# Patient Record
Sex: Female | Born: 1955 | Race: White | Hispanic: No | Marital: Married | Smoking: Former smoker
Health system: Southern US, Community
[De-identification: ages and names within clinical notes are randomized; demographics above are authoritative.]

## PROBLEM LIST (undated history)

## (undated) DIAGNOSIS — I10 Essential (primary) hypertension: Secondary | ICD-10-CM

## (undated) DIAGNOSIS — K219 Gastro-esophageal reflux disease without esophagitis: Secondary | ICD-10-CM

## (undated) DIAGNOSIS — F411 Generalized anxiety disorder: Secondary | ICD-10-CM

## (undated) HISTORY — DX: Essential (primary) hypertension: I10

## (undated) HISTORY — DX: Generalized anxiety disorder: F41.1

## (undated) HISTORY — PX: CERVICAL FUSION: SHX112

## (undated) HISTORY — PX: FOOT SURGERY: SHX648

## (undated) HISTORY — DX: Gastro-esophageal reflux disease without esophagitis: K21.9

---

## 2015-02-11 ENCOUNTER — Other Ambulatory Visit (HOSPITAL_BASED_OUTPATIENT_CLINIC_OR_DEPARTMENT_OTHER): Payer: Self-pay | Admitting: Neurosurgery

## 2015-02-11 DIAGNOSIS — M5412 Radiculopathy, cervical region: Secondary | ICD-10-CM

## 2015-02-21 ENCOUNTER — Ambulatory Visit (HOSPITAL_BASED_OUTPATIENT_CLINIC_OR_DEPARTMENT_OTHER)
Admission: RE | Admit: 2015-02-21 | Discharge: 2015-02-21 | Disposition: A | Discharge status: Home / Self Care | Payer: BLUE CROSS/BLUE SHIELD | Source: Ambulatory Visit | Attending: Neurosurgery | Admitting: Neurosurgery

## 2015-02-21 DIAGNOSIS — M5412 Radiculopathy, cervical region: Secondary | ICD-10-CM

## 2015-02-21 DIAGNOSIS — M542 Cervicalgia: Secondary | ICD-10-CM | POA: Diagnosis present

## 2015-02-21 DIAGNOSIS — M4682 Other specified inflammatory spondylopathies, cervical region: Secondary | ICD-10-CM | POA: Insufficient documentation

## 2015-02-21 DIAGNOSIS — M4683 Other specified inflammatory spondylopathies, cervicothoracic region: Secondary | ICD-10-CM | POA: Insufficient documentation

## 2015-09-16 ENCOUNTER — Encounter (HOSPITAL_BASED_OUTPATIENT_CLINIC_OR_DEPARTMENT_OTHER): Payer: Self-pay | Admitting: *Deleted

## 2015-09-16 ENCOUNTER — Emergency Department (HOSPITAL_BASED_OUTPATIENT_CLINIC_OR_DEPARTMENT_OTHER): Payer: BLUE CROSS/BLUE SHIELD

## 2015-09-16 ENCOUNTER — Emergency Department (HOSPITAL_BASED_OUTPATIENT_CLINIC_OR_DEPARTMENT_OTHER)
Admission: EM | Admit: 2015-09-16 | Discharge: 2015-09-16 | Disposition: A | Discharge status: Home / Self Care | Payer: BLUE CROSS/BLUE SHIELD | Attending: Emergency Medicine | Admitting: Emergency Medicine

## 2015-09-16 DIAGNOSIS — R51 Headache: Secondary | ICD-10-CM | POA: Insufficient documentation

## 2015-09-16 DIAGNOSIS — Z79899 Other long term (current) drug therapy: Secondary | ICD-10-CM | POA: Diagnosis not present

## 2015-09-16 DIAGNOSIS — I10 Essential (primary) hypertension: Secondary | ICD-10-CM | POA: Diagnosis not present

## 2015-09-16 DIAGNOSIS — R079 Chest pain, unspecified: Secondary | ICD-10-CM

## 2015-09-16 DIAGNOSIS — R0602 Shortness of breath: Secondary | ICD-10-CM | POA: Diagnosis not present

## 2015-09-16 DIAGNOSIS — R42 Dizziness and giddiness: Secondary | ICD-10-CM | POA: Diagnosis not present

## 2015-09-16 DIAGNOSIS — R61 Generalized hyperhidrosis: Secondary | ICD-10-CM | POA: Insufficient documentation

## 2015-09-16 DIAGNOSIS — R110 Nausea: Secondary | ICD-10-CM | POA: Insufficient documentation

## 2015-09-16 DIAGNOSIS — R1013 Epigastric pain: Secondary | ICD-10-CM | POA: Insufficient documentation

## 2015-09-16 DIAGNOSIS — F419 Anxiety disorder, unspecified: Secondary | ICD-10-CM | POA: Diagnosis not present

## 2015-09-16 DIAGNOSIS — Z87891 Personal history of nicotine dependence: Secondary | ICD-10-CM | POA: Insufficient documentation

## 2015-09-16 HISTORY — DX: Essential (primary) hypertension: I10

## 2015-09-16 LAB — CBC
HCT: 36.3 % (ref 36.0–46.0)
Hemoglobin: 12.2 g/dL (ref 12.0–15.0)
MCH: 29.3 pg (ref 26.0–34.0)
MCHC: 33.6 g/dL (ref 30.0–36.0)
MCV: 87.1 fL (ref 78.0–100.0)
PLATELETS: 237 10*3/uL (ref 150–400)
RBC: 4.17 MIL/uL (ref 3.87–5.11)
RDW: 12.1 % (ref 11.5–15.5)
WBC: 6.4 10*3/uL (ref 4.0–10.5)

## 2015-09-16 LAB — BASIC METABOLIC PANEL
ANION GAP: 9 (ref 5–15)
BUN: 17 mg/dL (ref 6–20)
CO2: 28 mmol/L (ref 22–32)
Calcium: 9.1 mg/dL (ref 8.9–10.3)
Chloride: 99 mmol/L — ABNORMAL LOW (ref 101–111)
Creatinine, Ser: 0.76 mg/dL (ref 0.44–1.00)
GFR calc Af Amer: >60 mL/min (ref >60)
Glucose, Bld: 104 mg/dL — ABNORMAL HIGH (ref 65–99)
POTASSIUM: 3.9 mmol/L (ref 3.5–5.1)
SODIUM: 136 mmol/L (ref 135–145)

## 2015-09-16 LAB — TROPONIN I: Troponin I: <0.03 ng/mL (ref <0.031)

## 2015-09-16 MED ORDER — ASPIRIN 81 MG PO CHEW
324.0000 mg | CHEWABLE_TABLET | Freq: Once | ORAL | Status: AC
  Administered 2015-09-16: 324 mg via ORAL
  Filled 2015-09-16: qty 4

## 2015-09-16 MED ORDER — GI COCKTAIL ~~LOC~~
30.0000 mL | Freq: Once | ORAL | Status: AC
  Administered 2015-09-16: 30 mL via ORAL
  Filled 2015-09-16: qty 30

## 2015-09-16 MED ORDER — NITROGLYCERIN 0.4 MG SL SUBL: 0.4000 mg | SUBLINGUAL_TABLET | SUBLINGUAL | Status: DC | PRN

## 2015-09-16 NOTE — Discharge Instructions (Signed)
Try Zantac 150 mg twice a day. °Nonspecific Chest Pain  °Chest pain can be caused by many different conditions. There is always a chance that your pain could be related to something serious, such as a heart attack or a blood clot in your lungs. Chest pain can also be caused by conditions that are not life-threatening. If you have chest pain, it is very important to follow up with your health care provider. °CAUSES  °Chest pain can be caused by: °· Heartburn. °· Pneumonia or bronchitis. °· Anxiety or stress. °· Inflammation around your heart (pericarditis) or lung (pleuritis or pleurisy). °· A blood clot in your lung. °· A collapsed lung (pneumothorax). It can develop suddenly on its own (spontaneous pneumothorax) or from trauma to the chest. °· Shingles infection (varicella-zoster virus). °· Heart attack. °· Damage to the bones, muscles, and cartilage that make up your chest wall. This can include: °¨ Bruised bones due to injury. °¨ Strained muscles or cartilage due to frequent or repeated coughing or overwork. °¨ Fracture to one or more ribs. °¨ Sore cartilage due to inflammation (costochondritis). °RISK FACTORS  °Risk factors for chest pain may include: °· Activities that increase your risk for trauma or injury to your chest. °· Respiratory infections or conditions that cause frequent coughing. °· Medical conditions or overeating that can cause heartburn. °· Heart disease or family history of heart disease. °· Conditions or health behaviors that increase your risk of developing a blood clot. °· Having had chicken pox (varicella zoster). °SIGNS AND SYMPTOMS °Chest pain can feel like: °· Burning or tingling on the surface of your chest or deep in your chest. °· Crushing, pressure, aching, or squeezing pain. °· Dull or sharp pain that is worse when you move, cough, or take a deep breath. °· Pain that is also felt in your back, neck, shoulder, or arm, or pain that spreads to any of these areas. °Your chest pain may  come and go, or it may stay constant. °DIAGNOSIS °Lab tests or other studies may be needed to find the cause of your pain. Your health care provider may have you take a test called an ambulatory ECG (electrocardiogram). An ECG records your heartbeat patterns at the time the test is performed. You may also have other tests, such as: °· Transthoracic echocardiogram (TTE). During echocardiography, sound waves are used to create a picture of all of the heart structures and to look at how blood flows through your heart. °· Transesophageal echocardiogram (TEE). This is a more advanced imaging test that obtains images from inside your body. It allows your health care provider to see your heart in finer detail. °· Cardiac monitoring. This allows your health care provider to monitor your heart rate and rhythm in real time. °· Holter monitor. This is a portable device that records your heartbeat and can help to diagnose abnormal heartbeats. It allows your health care provider to track your heart activity for several days, if needed. °· Stress tests. These can be done through exercise or by taking medicine that makes your heart beat more quickly. °· Blood tests. °· Imaging tests. °TREATMENT  °Your treatment depends on what is causing your chest pain. Treatment may include: °· Medicines. These may include: °¨ Acid blockers for heartburn. °¨ Anti-inflammatory medicine. °¨ Pain medicine for inflammatory conditions. °¨ Antibiotic medicine, if an infection is present. °¨ Medicines to dissolve blood clots. °¨ Medicines to treat coronary artery disease. °· Supportive care for conditions that do not require medicines. This   may include: °¨ Resting. °¨ Applying heat or cold packs to injured areas. °¨ Limiting activities until pain decreases. °HOME CARE INSTRUCTIONS °· If you were prescribed an antibiotic medicine, finish it all even if you start to feel better. °· Avoid any activities that bring on chest pain. °· Do not use any tobacco  products, including cigarettes, chewing tobacco, or electronic cigarettes. If you need help quitting, ask your health care provider. °· Do not drink alcohol. °· Take medicines only as directed by your health care provider. °· Keep all follow-up visits as directed by your health care provider. This is important. This includes any further testing if your chest pain does not go away. °· If heartburn is the cause for your chest pain, you may be told to keep your head raised (elevated) while sleeping. This reduces the chance that acid will go from your stomach into your esophagus. °· Make lifestyle changes as directed by your health care provider. These may include: °¨ Getting regular exercise. Ask your health care provider to suggest some activities that are safe for you. °¨ Eating a heart-healthy diet. A registered dietitian can help you to learn healthy eating options. °¨ Maintaining a healthy weight. °¨ Managing diabetes, if necessary. °¨ Reducing stress. °SEEK MEDICAL CARE IF: °· Your chest pain does not go away after treatment. °· You have a rash with blisters on your chest. °· You have a fever. °SEEK IMMEDIATE MEDICAL CARE IF:  °· Your chest pain is worse. °· You have an increasing cough, or you cough up blood. °· You have severe abdominal pain. °· You have severe weakness. °· You faint. °· You have chills. °· You have sudden, unexplained chest discomfort. °· You have sudden, unexplained discomfort in your arms, back, neck, or jaw. °· You have shortness of breath at any time. °· You suddenly start to sweat, or your skin gets clammy. °· You feel nauseous or you vomit. °· You suddenly feel light-headed or dizzy. °· Your heart begins to beat quickly, or it feels like it is skipping beats. °These symptoms may represent a serious problem that is an emergency. Do not wait to see if the symptoms will go away. Get medical help right away. Call your local emergency services (911 in the [place] Do not drive yourself to the  hospital. °  °This information is not intended to replace advice given to you by your health care provider. Make sure you discuss any questions you have with your health care provider. °  °Document Released: 05/18/2005 Document Revised: 08/29/2014 Document Reviewed: 03/14/2014 °Elsevier Interactive Patient Education ©2016 [employer] ° °

## 2015-09-16 NOTE — ED Provider Notes (Signed)
CSN: [id number]     Arrival date & time 09/16/15  1701 History  By signing my name below, I, Meghan Hubbard, attest that this documentation has been prepared under the direction and in the presence of Meghan Plan, DO. Electronically Signed: Linus Hubbard, ED Scribe. 09/16/2015. 6:12 PM.    Chief Complaint  Patient presents with  . Chest Pain   The history is provided by the patient. No language interpreter was used.   HPI Comments: Meghan Hubbard is a 60 y.o. female with a h/o HTN and "heart burn" who presents to the Emergency Department complaining of chest pain that began 2 weeks ago but worsened today, PTA. Pt describes her pain as if "food is stuck in her throat." She reports her pain began when she was drinking water during a workout at the gym. Pt also reports dizziness, nausea, HA, diaphoresis and SOB. Pt tried to take deep breathes to alleviate her pain with no relief. Pt denies radiation to her back or to any of her extremities. Pt was referred to the ED after being seen at [facility] today. Pt denies any BLE swelling, vomiting, fevers, chills, cough, congestion, or any other associated sx at this time. Pt is a non-smoker. Pt denies family or personal cardiac history. No recent long distance travel. She denies any recent surgeries. No prior history of cancer.  Past Medical History  Diagnosis Date  . Hypertension   . Anxiety    Past Surgical History  Procedure Laterality Date  . Cervical fusion    . Foot surgery      x 3   No family history on file. Social History  Substance Use Topics  . Smoking status: Former Games developer  . Smokeless tobacco: Never Used  . Alcohol Use: No   OB History    No data available     Review of Systems  Constitutional: Positive for diaphoresis. Negative for fever and chills.  HENT: Negative for congestion and rhinorrhea.   Eyes: Negative for redness and visual disturbance.  Respiratory: Positive for shortness of breath. Negative for cough  and wheezing.   Cardiovascular: Positive for chest pain. Negative for palpitations.  Gastrointestinal: Positive for nausea. Negative for vomiting.  Genitourinary: Negative for dysuria and urgency.  Musculoskeletal: Negative for myalgias and arthralgias.  Skin: Negative for pallor and wound.  Neurological: Positive for dizziness and headaches.      Allergies  Review of patient's allergies indicates no known allergies.  Home Medications   Prior to Admission medications   Medication Sig Start Date End Date Taking? Authorizing Provider  ALPRAZolam (XANAX PO) Take by mouth.   Yes Historical Provider, MD  BISOPROLOL-HYDROCHLOROTHIAZIDE PO Take by mouth.   Yes Historical Provider, MD  MELOXICAM PO Take by mouth.   Yes Historical Provider, MD  TIZANIDINE HCL PO Take by mouth.   Yes Historical Provider, MD   BP 103/66 mmHg  Pulse 56  Temp(Src) 98.3 F (36.8 C) (Oral)  Resp 17  Ht  (1.626 m)  Wt 161 lb (73.029 kg)  BMI 27.62 kg/m2  SpO2 96% Physical Exam  Constitutional: She is oriented to person, place, and time. She appears well-developed and well-nourished. No distress.  HENT:  Head: Normocephalic and atraumatic.  Eyes: EOM are normal. Pupils are equal, round, and reactive to light.  Neck: Normal range of motion. Neck supple.  Cardiovascular: Normal rate, regular rhythm, normal heart sounds and intact distal pulses.  Exam reveals no gallop and no friction rub.  No murmur heard. Pulmonary/Chest: Effort normal and breath sounds normal. No respiratory distress. She has no wheezes. She has no rales.  Abdominal: Soft. She exhibits no distension. There is no tenderness.  Epigastrium TTP, no RUQ tenderness  Musculoskeletal: Normal range of motion. She exhibits no edema or tenderness.  Neurological: She is alert and oriented to person, place, and time.  Skin: Skin is warm and dry. She is not diaphoretic.  Psychiatric: She has a normal mood and affect. Her behavior is normal.   Nursing note and vitals reviewed.   ED Course  Procedures  DIAGNOSTIC STUDIES: Oxygen Saturation is 100% on RA, nl by my interpretation.    COORDINATION OF CARE: 6:12 PM Will give ASA, GI cocktail and Nitroglycerin. Will order CXR and blood test.  Discussed treatment Hubbard with pt at bedside and pt agreed to Hubbard.   Labs Review Labs Reviewed  BASIC METABOLIC PANEL - Abnormal; Notable for the following:    Chloride 99 (*)    Glucose, Bld 104 (*)    All other components within normal limits  CBC  TROPONIN I  TROPONIN I    Imaging Review Dg Chest 2 View  09/16/2015  CLINICAL DATA:  Mid chest heaviness with heartburn for 2 weeks. Near syncope today. Former smoker. EXAM: CHEST  2 VIEW COMPARISON:  None. FINDINGS: Normal heart size and pulmonary vascularity. No focal airspace disease or consolidation in the lungs. No blunting of costophrenic angles. No pneumothorax. Mediastinal contours appear intact. Postoperative changes in the cervical spine. Mild degenerative changes in the thoracic spine. IMPRESSION: No active cardiopulmonary disease. Electronically Signed   By: Burman Nieves M.D.   On: 09/16/2015 18:57   I have personally reviewed and evaluated these images and lab results as part of my medical decision-making.   EKG Interpretation   Date/Time:  Wednesday September 16 2015 17:09:42 EST Ventricular Rate:  63 PR Interval:  210 QRS Duration: 78 QT Interval:  422 QTC Calculation: 431 R Axis:   51 Text Interpretation:  Sinus rhythm with 1st degree A-V block Nonspecific  ST abnormality Abnormal ECG No old tracing to compare Confirmed by Cardelia Sassano  MD, DANIEL [phone]) on 09/16/2015 6:36:11 PM      MDM   Final diagnoses:  Chest pain, unspecified chest pain type    59 yoF with atypical chest pain for two days.  Saw urgent care and referred here.  Patient low risk HEAR score. Wells 0.  Doubt PE.  Delta trop negative, symptoms significantly improved with GI cocktail.  Suspect  GERD, will start zantac, PCP follow up.   I have discussed the diagnosis/risks/treatment options with the patient and believe the pt to be eligible for discharge home to follow-up with PCP. We also discussed returning to the ED immediately if new or worsening sx occur. We discussed the sx which are most concerning (e.g., sudden worsening pain, fever, inability to tolerate by mouth) that necessitate immediate return. Medications administered to the patient during their visit and any new prescriptions provided to the patient are listed below.  Medications given during this visit Medications  aspirin chewable tablet 324 mg (324 mg Oral Given 09/16/15 1825)  gi cocktail (Maalox,Lidocaine,Donnatal) (30 mLs Oral Given 09/16/15 1826)    Discharge Medication List as of 09/16/2015  9:52 PM      The patient appears reasonably screen and/or stabilized for discharge and I doubt any other medical condition or other [facility] requiring further screening, evaluation, or treatment in the ED at this time prior  to discharge.    I personally performed the services described in this documentation, which was scribed in my presence. The recorded information has been reviewed and is accurate.     Meghan Plan, DO 09/17/15 1411

## 2015-09-16 NOTE — ED Notes (Signed)
Patient states she has a two week history of increasing indigestion, burning and burping.  States this morning she had lightheadedness and became hot and sweaty.  States she has had dizziness all day.  Also, has pressure in the central chest and sob with exertion or talking.

## 2015-09-23 ENCOUNTER — Encounter: Payer: Self-pay | Admitting: Osteopathic Medicine

## 2015-09-23 ENCOUNTER — Ambulatory Visit (INDEPENDENT_AMBULATORY_CARE_PROVIDER_SITE_OTHER): Payer: BLUE CROSS/BLUE SHIELD | Admitting: Osteopathic Medicine

## 2015-09-23 VITALS — BP 110/70 | HR 64 | Ht 64.0 in | Wt 158.0 lb

## 2015-09-23 DIAGNOSIS — K219 Gastro-esophageal reflux disease without esophagitis: Secondary | ICD-10-CM

## 2015-09-23 DIAGNOSIS — F411 Generalized anxiety disorder: Secondary | ICD-10-CM

## 2015-09-23 DIAGNOSIS — R7301 Impaired fasting glucose: Secondary | ICD-10-CM

## 2015-09-23 DIAGNOSIS — I10 Essential (primary) hypertension: Secondary | ICD-10-CM

## 2015-09-23 DIAGNOSIS — Z13220 Encounter for screening for lipoid disorders: Secondary | ICD-10-CM | POA: Diagnosis not present

## 2015-09-23 DIAGNOSIS — Z131 Encounter for screening for diabetes mellitus: Secondary | ICD-10-CM | POA: Diagnosis not present

## 2015-09-23 DIAGNOSIS — Z780 Asymptomatic menopausal state: Secondary | ICD-10-CM

## 2015-09-23 NOTE — Progress Notes (Signed)
HPI: Meghan Hubbard is a 60 y.o. female who presents to [facility] today for chief complaint of:  Chief Complaint  Patient presents with  . Establish Care    Follow-up from ER, was there for chest pain rule out    ER FOLLOWUP . Context: was having bad heartburn going on for 2 weeks, then started feeling dizzy and SOB and went to UC 1 week ago and was sent to ER   . Modifying factors: Was started on Zantac by ER and she states doing a bit better, she is taking htis about 1 per day, in the past Tums as needed.  No hx gastric ulcer.  . Assoc signs/symptoms: No black/bloody stool, no emesis. Was feeling like lump in throat but this is better. Dizziness resolved.   Anxiety - on Xanax 1/2 tab daily if needed. Occasional panic issues, SOB issues. Previously was on daily antianxiety medicine (not sure name, SSRI?) but this "made me feel like a zombie" so only wants PRN Rx.   MSK - neck pain and arthritis and takes meds as below as needed.   Just moved from [place]. Had appt to see me in 3/17 anyway for wellness exam.    Past medical, social and family history reviewed: Past Medical History  Diagnosis Date  . GERD (gastroesophageal reflux disease) 09/24/2015  . Hypertension   . Anxiety state 09/24/2015   Past Surgical History  Procedure Laterality Date  . Cervical fusion    . Foot surgery      x 3   Social History  Substance Use Topics  . Smoking status: Former Games developer  . Smokeless tobacco: Never Used  . Alcohol Use: No   Family History  Problem Relation Age of Onset  . Cancer Mother     LEUKEMIA  . Hypertension Mother   . Cancer Father     LUNG    Current Outpatient Prescriptions  Medication Sig Dispense Refill  . ALPRAZolam (XANAX PO) Take by mouth.    Marland Kitchen BISOPROLOL-HYDROCHLOROTHIAZIDE PO Take by mouth.    . MELOXICAM PO Take by mouth.    . ranitidine (ZANTAC) 150 MG tablet Take 150 mg by mouth 2 (two) times daily.    Marland Kitchen TIZANIDINE HCL  PO Take by mouth.     No current facility-administered medications for this visit.   No Known Allergies    Review of Systems: CONSTITUTIONAL:  No  fever, no chills, No  unintentional weight changes HEAD/EYES/EARS/NOSE/THROAT: No  headache, no vision change, no hearing change, No  sore throat, No  sinus pressure CARDIAC: No  chest pain, No  pressure, No palpitations, No  orthopnea RESPIRATORY: No  cough, No  shortness of breath/wheeze GASTROINTESTINAL: No  nausea, No  vomiting, No  abdominal pain, No  blood in stool, No  diarrhea, No  constipation , (+) heartburn, improved on Zantac MUSCULOSKELETAL: No  myalgia/arthralgia GENITOURINARY: No  incontinence, No  abnormal genital bleeding/discharge SKIN: rash/wounds/concerning lesions  HEM/ONC: No  easy bruising/bleeding, No  abnormal lymph node ENDOCRINE: No polyuria/polydipsia/polyphagia, No  heat/cold intolerance  NEUROLOGIC: No  weakness, No  dizziness, No  slurred speech PSYCHIATRIC: No  concerns with depression, (+) concerns with occasional anxiety, No sleep problems, PQH2 (-)  Exam:  BP 110/70 mmHg  Pulse 64  Ht  (1.626 m)  Wt 158 lb (71.668 kg)  BMI 27.11 kg/m2 Constitutional: VS see above. General Appearance: alert, well-developed, well-nourished, NAD Eyes: Normal lids and conjunctive, non-icteric sclera, PERRLA Ears,  Nose, Mouth, Throat: MMM, Normal external inspection ears/nares/mouth/lips/gums, TM normal bilaterally. Pharynx no erythema, no exudate.  Neck: No masses, trachea midline. No thyroid enlargement/tenderness/mass appreciated. No lymphadenopathy Respiratory: Normal respiratory effort. no wheeze, no rhonchi, no rales Cardiovascular: S1/S2 normal, no murmur, no rub/gallop auscultated. RRR. No lower extremity edema. Gastrointestinal: Nontender, no masses. No hepatomegaly, no splenomegaly. No hernia appreciated. Bowel sounds normal. Rectal exam deferred.  Musculoskeletal: Gait normal. No clubbing/cyanosis of digits.   Neurological: No cranial nerve deficit on limited exam. Motor and sensation intact and symmetric Skin: warm, dry, intact. No rash/ulcer. No concerning nevi or subq nodules on limited exam.   Psychiatric: Normal judgment/insight. Normal mood and affect. Oriented x3.    No results found for this or any previous visit (from the past 72 hour(s)).    ASSESSMENT/PLAN:  Gastroesophageal reflux disease, esophagitis presence not specified - patient opts to continue Zantac, advised we do have the option to do H pylori test, patient declines this at this time, we'll consider H. pylori test and PPI if symptoms worsen  Essential hypertension - Plan: CBC with Differential/Platelet, COMPLETE METABOLIC PANEL WITH GFR, TSH - given patient's symptoms of dizziness and relatively low blood pressure today, question if she really needs to be on the dose of blood pressure meds she is on, she can't member the specific dose of the combo pill as noted above, I advised her to call us with this dose, in the meantime can cut in half and follow-up with nurse visit for blood pressure check and to confirm measurement with her own cuff  Lipid screening - Plan: Lipid panel  Diabetes mellitus screening  Postmenopausal - Plan: VITAMIN D 25 Hydroxy (Vit-D Deficiency, Fractures)   Return at your convenince in next few weeks for BP check with nurse, for [facility] [place] [facility].

## 2015-09-24 ENCOUNTER — Encounter: Payer: Self-pay | Admitting: Osteopathic Medicine

## 2015-09-24 DIAGNOSIS — F411 Generalized anxiety disorder: Secondary | ICD-10-CM

## 2015-09-24 DIAGNOSIS — K219 Gastro-esophageal reflux disease without esophagitis: Secondary | ICD-10-CM

## 2015-09-24 DIAGNOSIS — I10 Essential (primary) hypertension: Secondary | ICD-10-CM

## 2015-09-24 HISTORY — DX: Gastro-esophageal reflux disease without esophagitis: K21.9

## 2015-09-24 HISTORY — DX: Essential (primary) hypertension: I10

## 2015-09-24 HISTORY — DX: Generalized anxiety disorder: F41.1

## 2015-10-09 ENCOUNTER — Telehealth: Payer: Self-pay | Admitting: Osteopathic Medicine

## 2015-10-09 NOTE — Telephone Encounter (Signed)
Pt called. She would like lab order faxed down for Mon 2/20 as she will be coming in to have blood drawn on this date. Thank you.

## 2015-10-09 NOTE — Telephone Encounter (Signed)
Dr. Alexander please see note below. Keelie Zemanek,CMA  

## 2015-10-09 NOTE — Telephone Encounter (Signed)
Labs already were ordered on visit to 117, anything else we need to do to release these results or can she just go downstairs and have these done?

## 2015-10-09 NOTE — Telephone Encounter (Signed)
Patient has been informed. Meghan Hubbard,CMA  

## 2015-10-12 LAB — COMPLETE METABOLIC PANEL WITH GFR
ALBUMIN: 4.4 g/dL (ref 3.6–5.1)
ALK PHOS: 32 U/L — AB (ref 33–130)
ALT: 17 U/L (ref 6–29)
AST: 20 U/L (ref 10–35)
BUN: 13 mg/dL (ref 7–25)
CALCIUM: 9.8 mg/dL (ref 8.6–10.4)
CHLORIDE: 102 mmol/L (ref 98–110)
CO2: 29 mmol/L (ref 20–31)
Creat: 0.8 mg/dL (ref 0.50–1.05)
GFR, EST NON AFRICAN AMERICAN: 82 mL/min (ref >=60)
Glucose, Bld: 102 mg/dL — ABNORMAL HIGH (ref 65–99)
POTASSIUM: 4.8 mmol/L (ref 3.5–5.3)
Sodium: 139 mmol/L (ref 135–146)
Total Bilirubin: 0.3 mg/dL (ref 0.2–1.2)
Total Protein: 6.6 g/dL (ref 6.1–8.1)

## 2015-10-12 LAB — CBC WITH DIFFERENTIAL/PLATELET
Basophils Absolute: 0 10*3/uL (ref 0.0–0.1)
Basophils Relative: 1 % (ref 0–1)
EOS PCT: 7 % — AB (ref 0–5)
Eosinophils Absolute: 0.3 10*3/uL (ref 0.0–0.7)
HEMATOCRIT: 38.2 % (ref 36.0–46.0)
HEMOGLOBIN: 12.7 g/dL (ref 12.0–15.0)
LYMPHS PCT: 38 % (ref 12–46)
Lymphs Abs: 1.7 10*3/uL (ref 0.7–4.0)
MCH: 29.3 pg (ref 26.0–34.0)
MCHC: 33.2 g/dL (ref 30.0–36.0)
MCV: 88.2 fL (ref 78.0–100.0)
MONO ABS: 0.4 10*3/uL (ref 0.1–1.0)
MONOS PCT: 10 % (ref 3–12)
MPV: 9.5 fL (ref 8.6–12.4)
NEUTROS ABS: 1.9 10*3/uL (ref 1.7–7.7)
Neutrophils Relative %: 44 % (ref 43–77)
Platelets: 243 10*3/uL (ref 150–400)
RBC: 4.33 MIL/uL (ref 3.87–5.11)
RDW: 13.5 % (ref 11.5–15.5)
WBC: 4.4 10*3/uL (ref 4.0–10.5)

## 2015-10-12 LAB — LIPID PANEL
CHOL/HDL RATIO: 2.4 ratio (ref <=5.0)
CHOLESTEROL: 213 mg/dL — AB (ref 125–200)
HDL: 89 mg/dL (ref >=46)
LDL Cholesterol: 113 mg/dL (ref <130)
TRIGLYCERIDES: 54 mg/dL (ref <150)
VLDL: 11 mg/dL (ref <30)

## 2015-10-12 LAB — TSH: TSH: 2.57 mIU/L

## 2015-10-13 LAB — VITAMIN D 25 HYDROXY (VIT D DEFICIENCY, FRACTURES): VIT D 25 HYDROXY: 34 ng/mL (ref 30–100)

## 2015-10-13 NOTE — Addendum Note (Signed)
Addended by: Deirdre Pippins on: 10/13/2015 08:00 AM   Modules accepted: Orders

## 2015-10-14 LAB — HEMOGLOBIN A1C
Hgb A1c MFr Bld: 5.9 % — ABNORMAL HIGH (ref <5.7)
MEAN PLASMA GLUCOSE: 123 mg/dL — AB (ref <117)

## 2015-11-02 ENCOUNTER — Ambulatory Visit (INDEPENDENT_AMBULATORY_CARE_PROVIDER_SITE_OTHER): Payer: BLUE CROSS/BLUE SHIELD | Admitting: Osteopathic Medicine

## 2015-11-02 ENCOUNTER — Encounter: Payer: Self-pay | Admitting: Osteopathic Medicine

## 2015-11-02 VITALS — BP 138/84 | HR 68 | Ht 64.0 in | Wt 155.0 lb

## 2015-11-02 DIAGNOSIS — R3129 Other microscopic hematuria: Secondary | ICD-10-CM

## 2015-11-02 DIAGNOSIS — F411 Generalized anxiety disorder: Secondary | ICD-10-CM

## 2015-11-02 DIAGNOSIS — I10 Essential (primary) hypertension: Secondary | ICD-10-CM | POA: Diagnosis not present

## 2015-11-02 DIAGNOSIS — R7303 Prediabetes: Secondary | ICD-10-CM

## 2015-11-02 DIAGNOSIS — M791 Myalgia, unspecified site: Secondary | ICD-10-CM

## 2015-11-02 DIAGNOSIS — Z23 Encounter for immunization: Secondary | ICD-10-CM

## 2015-11-02 DIAGNOSIS — Z0000 Encounter for general adult medical examination without abnormal findings: Secondary | ICD-10-CM

## 2015-11-02 DIAGNOSIS — M1990 Unspecified osteoarthritis, unspecified site: Secondary | ICD-10-CM

## 2015-11-02 DIAGNOSIS — Z1159 Encounter for screening for other viral diseases: Secondary | ICD-10-CM

## 2015-11-02 DIAGNOSIS — Z1239 Encounter for other screening for malignant neoplasm of breast: Secondary | ICD-10-CM

## 2015-11-02 LAB — POCT URINALYSIS DIPSTICK
BILIRUBIN UA: NEGATIVE
Glucose, UA: NEGATIVE
KETONES UA: NEGATIVE
LEUKOCYTES UA: NEGATIVE
Nitrite, UA: NEGATIVE
PH UA: 6
Protein, UA: NEGATIVE
Spec Grav, UA: 1.01
Urobilinogen, UA: 0.2

## 2015-11-02 MED ORDER — MELOXICAM 15 MG PO TABS: 7.5000 mg | ORAL_TABLET | Freq: Every day | ORAL | Status: DC

## 2015-11-02 MED ORDER — TIZANIDINE HCL 4 MG PO TABS: 2.0000 mg | ORAL_TABLET | Freq: Every day | ORAL | Status: DC

## 2015-11-02 MED ORDER — ALPRAZOLAM 0.25 MG PO TABS: ORAL_TABLET | ORAL | Status: DC

## 2015-11-02 MED ORDER — TRAMADOL HCL 50 MG PO TABS: 50.0000 mg | ORAL_TABLET | Freq: Four times a day (QID) | ORAL | Status: DC | PRN

## 2015-11-02 MED ORDER — ZOSTER VACCINE LIVE 19400 UNT/0.65ML ~~LOC~~ SOLR: 0.6500 mL | Freq: Once | SUBCUTANEOUS | Status: DC

## 2015-11-02 MED ORDER — BISOPROLOL-HYDROCHLOROTHIAZIDE 5-6.25 MG PO TABS: 0.5000 | ORAL_TABLET | Freq: Every day | ORAL | Status: DC

## 2015-11-02 NOTE — Patient Instructions (Signed)
Let us know if you do not hear back about mammogram scheduling.   Plan to follow up in 6 months for recheck labs - can call the lab ahead of time to make sure the orders are released.   Please call the office when you are in need of refills for controlled substances: Alprazolam and Tramadol. We are working on finalizing a policy for our patients to whom we prescribe controlled medications, you may be asked to come in for a visit to go over a medication contract at some point in order to maintain your refills.

## 2015-11-02 NOTE — Progress Notes (Signed)
HPI: Meghan DexterBrenda D Hubbard is a 60 y.o. female who [facility] [place] today for chief complaint of:  Chief Complaint  Patient presents with  . Annual Exam    . Recent labs reviewed with patient, all questions answered. Preventative care reviewed as below.  Hypertension: Well-controlled, patient not in need of refills. She is taking half a tablet of bisoprolol-hydrochlorothiazide.  Arthritis: Patient likes to have meloxicam and tramadol on hand when necessary, uses tramadol sparingly.  Anxiety: Rare use of Xanax, she requests refill  GERD: Improved since she has changed diet, she is okay to get Zantac over-the-counter   Past medical, social and family history reviewed: Past Medical History  Diagnosis Date  . GERD (gastroesophageal reflux disease) 09/24/2015  . Hypertension   . Anxiety state 09/24/2015   Past Surgical History  Procedure Laterality Date  . Cervical fusion    . Foot surgery      x 3   Social History  Substance Use Topics  . Smoking status: Former Games developermoker  . Smokeless tobacco: Never Used  . Alcohol Use: No   Family History  Problem Relation Age of Onset  . Cancer Mother     LEUKEMIA  . Hypertension Mother   . Cancer Father     LUNG    Current Outpatient Prescriptions  Medication Sig Dispense Refill  . ALPRAZolam (XANAX PO) Take by mouth.    Marland Kitchen. BISOPROLOL-HYDROCHLOROTHIAZIDE PO Take by mouth.    . MELOXICAM PO Take by mouth.    . ranitidine (ZANTAC) 150 MG tablet Take 150 mg by mouth 2 (two) times daily.    Marland Kitchen. TIZANIDINE HCL PO Take by mouth.    . traMADol (ULTRAM) 50 MG tablet Take by mouth every 6 (six) hours as needed.     No current facility-administered medications for this visit.   No Known Allergies    Review of Systems: CONSTITUTIONAL:  No  fever, no chills, No  unintentional weight changes HEAD/EYES/EARS/NOSE/THROAT: No  headache, no vision change, no hearing change, No  sore throat, No  sinus  pressure CARDIAC: No  chest pain, No  pressure, No palpitations, No  orthopnea RESPIRATORY: No  cough, No  shortness of breath/wheeze GASTROINTESTINAL: No  nausea, No  vomiting, No  abdominal pain, No  blood in stool, No  diarrhea, No  constipation  MUSCULOSKELETAL: (+) occasional myalgia/arthralgia GENITOURINARY: No  incontinence, No  abnormal genital bleeding/discharge SKIN: No  rash/wounds/concerning lesions HEM/ONC: No  easy bruising/bleeding, No  abnormal lymph node ENDOCRINE: No polyuria/polydipsia/polyphagia, No  heat/cold intolerance  NEUROLOGIC: No  weakness, No  dizziness, No  slurred speech PSYCHIATRIC: No  concerns with depression, No  concerns with anxiety, (+) sleep problems  Exam:  BP 138/84 mmHg  Pulse 68  Ht 5\' 4"  (1.626 m)  Wt 155 lb (70.308 kg)  BMI 26.59 kg/m2 Constitutional: VS see above. General Appearance: alert, well-developed, well-nourished, NAD Neck: No masses, trachea midline. No thyroid enlargement/tenderness/mass appreciated. No lymphadenopathy Respiratory: Normal respiratory effort. no wheeze, no rhonchi, no rales Cardiovascular: S1/S2 normal, no murmur, no rub/gallop auscultated. RRR. No lower extremity edema. Psychiatric: Normal judgment/insight. Normal mood and affect. Oriented x3.    Results for orders placed or performed in visit on 11/02/15 (from the past 72 hour(s))  Urine Microscopic     Status: None   Collection Time: 11/02/15  5:03 PM  Result Value Ref Range   WBC, UA NONE SEEN <=5 WBC/HPF   RBC / HPF 0-2 <=2 RBC/HPF  Squamous Epithelial / LPF NONE SEEN <=5 HPF   Bacteria, UA NONE SEEN NONE SEEN HPF   Crystals NONE SEEN NONE SEEN HPF   Casts NONE SEEN NONE SEEN LPF   Yeast NONE SEEN NONE SEEN HPF  Urinalysis Dipstick     Status: Abnormal   Collection Time: 11/02/15  5:09 PM  Result Value Ref Range   Color, UA yellow    Clarity, UA clear    Glucose, UA negative    Bilirubin, UA negative    Ketones, UA negative    Spec Grav, UA 1.010     Blood, UA small    pH, UA 6.0    Protein, UA negative    Urobilinogen, UA 0.2    Nitrite, UA negative    Leukocytes, UA Negative Negative      ASSESSMENT/PLAN: Need to confirm last tetanus vaccine, patient got flu shot in prescription for Zostavax today. Plan for add-on hep C test when we redraw labs in 6 months for A1c. Patient states recent Pap was normal, request records. Patient will also check up on when last colonoscopy was, would consider cologard for future screening if colonoscopy report totally negative.  Patient also requests urine screen, she states that she has long-standing history of blood in the urine.   Refills as below, patient advised will not refill controlled substances through mail order, she'll have to contact the office when she is running low on these, at some point may consider controlled substance contract.    Annual physical exam  Breast cancer screening - Plan: MM DIGITAL SCREENING BILATERAL  Need for influenza vaccination - Plan: Flu Vaccine QUAD 36+ mos IM  Need for zoster vaccination - Plan: zoster vaccine live, PF, (ZOSTAVAX) 57846 UNT/0.65ML injection  Need for hepatitis C screening test - Plan: Hepatitis C antibody  Prediabetes - Plan: Hemoglobin A1c  Anxiety state - Plan: ALPRAZolam (XANAX) 0.25 MG tablet  Essential hypertension - Plan: bisoprolol-hydrochlorothiazide (ZIAC) 5-6.25 MG tablet  Muscle pain - Plan: tiZANidine (ZANAFLEX) 4 MG tablet  Arthritis - Plan: traMADol (ULTRAM) 50 MG tablet, meloxicam (MOBIC) 15 MG tablet, DISCONTINUED: meloxicam (MOBIC) 15 MG tablet  Microscopic hematuria - Plan: Urine Microscopic    Return in about 6 months (around 05/04/2016) for LAB VISIT .  FEMALE PREVENTIVE CARE  ANNUAL SCREENING/COUNSELING Tobacco - quit 4 years ago, prior to that 12 years at about 1/2 ppd  Alcohol - none Diet/Exercise - HEALTHY HABITS DISCUSSED TO DECREASE CV RISK Sexual Health - Yes with female. STI - The patient denies  history of sexually transmitted disease. INTERESTED IN STI TESTING - no Depression - PQH2 Negative Domestic violence concerns - no HTN SCREENING - SEE VITALS Vaccination status - SEE BELOW  INFECTIOUS DISEASE SCREENING HIV - needs but declined GC/CT -does not need HepC - needs TB - does not need  DISEASE SCREENING Lipid - does not need DM2  - does not need Osteoporosis - does not need  CANCER SCREENING Cervical - does not need, Breast -needs Lung - does not need Colon - does not need, need records  ADULT VACCINATION Influenza - was given Td - was not indicated, need records Zoster - indicated Pneumonia - was not indicated

## 2015-11-03 DIAGNOSIS — M791 Myalgia, unspecified site: Secondary | ICD-10-CM | POA: Insufficient documentation

## 2015-11-03 DIAGNOSIS — R3129 Other microscopic hematuria: Secondary | ICD-10-CM | POA: Insufficient documentation

## 2015-11-03 DIAGNOSIS — M1990 Unspecified osteoarthritis, unspecified site: Secondary | ICD-10-CM | POA: Insufficient documentation

## 2015-11-03 LAB — URINALYSIS, MICROSCOPIC ONLY
Bacteria, UA: NONE SEEN [HPF]
CASTS: NONE SEEN [LPF]
CRYSTALS: NONE SEEN [HPF]
SQUAMOUS EPITHELIAL / LPF: NONE SEEN [HPF] (ref <=5)
WBC, UA: NONE SEEN WBC/HPF (ref <=5)
YEAST: NONE SEEN [HPF]

## 2015-11-16 ENCOUNTER — Telehealth: Payer: Self-pay | Admitting: *Deleted

## 2015-11-16 NOTE — Telephone Encounter (Signed)
The patient called and left a message on vm stating she had blood work that was for supposed to be for her annual exam but was coded for hypertension. She states she wants this changed because her insurance will not pay for it.

## 2015-11-23 NOTE — Telephone Encounter (Signed)
I called [facility] at [phone] and spoke with Chales AbrahamsMary Ann today.  I added the diagnosis code Z00.00 (annual exam) as her primary diagnosis code for her labs.  Steward DroneBrenda will need to allow 4 weeks for [facility] to reprocess the claims with the added diagnosis code.

## 2015-12-15 ENCOUNTER — Ambulatory Visit (HOSPITAL_BASED_OUTPATIENT_CLINIC_OR_DEPARTMENT_OTHER)
Admission: RE | Admit: 2015-12-15 | Discharge: 2015-12-15 | Disposition: A | Discharge status: Home / Self Care | Payer: BLUE CROSS/BLUE SHIELD | Source: Ambulatory Visit | Attending: Osteopathic Medicine | Admitting: Osteopathic Medicine

## 2015-12-15 DIAGNOSIS — Z1239 Encounter for other screening for malignant neoplasm of breast: Secondary | ICD-10-CM

## 2015-12-15 DIAGNOSIS — Z1231 Encounter for screening mammogram for malignant neoplasm of breast: Secondary | ICD-10-CM | POA: Diagnosis not present

## 2016-03-22 ENCOUNTER — Other Ambulatory Visit: Payer: Self-pay | Admitting: Osteopathic Medicine

## 2016-03-22 DIAGNOSIS — F411 Generalized anxiety disorder: Secondary | ICD-10-CM

## 2016-06-03 ENCOUNTER — Emergency Department (INDEPENDENT_AMBULATORY_CARE_PROVIDER_SITE_OTHER)
Admission: EM | Admit: 2016-06-03 | Discharge: 2016-06-03 | Disposition: A | Discharge status: Home / Self Care | Payer: BLUE CROSS/BLUE SHIELD | Source: Home / Self Care | Attending: Family Medicine | Admitting: Family Medicine

## 2016-06-03 ENCOUNTER — Encounter: Payer: Self-pay | Admitting: Emergency Medicine

## 2016-06-03 DIAGNOSIS — M10072 Idiopathic gout, left ankle and foot: Secondary | ICD-10-CM | POA: Diagnosis not present

## 2016-06-03 MED ORDER — HYDROCODONE-ACETAMINOPHEN 5-325 MG PO TABS: ORAL_TABLET | ORAL | 0 refills | Status: DC

## 2016-06-03 MED ORDER — PREDNISONE 20 MG PO TABS: ORAL_TABLET | ORAL | 0 refills | Status: DC

## 2016-06-03 NOTE — ED Provider Notes (Signed)
[facility]    CSN: [id number] Arrival date & time: 06/03/16  1449     History   Chief Complaint Chief Complaint  Patient presents with  . Foot Pain    HPI Meghan Hubbard is a 60 y.o. female.   Patient complains of pain in her left great toe for two days.  She recalls no injury.  She has a past history of occasional episodes of gout.   The history is provided by the patient.  Foot Pain  This is a new problem. The current episode started 2 days ago. The problem occurs constantly. The problem has been gradually worsening. Associated symptoms comments: none. The symptoms are aggravated by standing. Nothing relieves the symptoms. She has tried nothing for the symptoms.    Past Medical History:  Diagnosis Date  . Anxiety state 09/24/2015  . GERD (gastroesophageal reflux disease) 09/24/2015  . Hypertension     Patient Active Problem List   Diagnosis Date Noted  . Muscle pain 11/03/2015  . Arthritis 11/03/2015  . Microscopic hematuria 11/03/2015  . Essential hypertension 09/24/2015  . GERD (gastroesophageal reflux disease) 09/24/2015  . Anxiety state 09/24/2015    Past Surgical History:  Procedure Laterality Date  . CERVICAL FUSION    . FOOT SURGERY     x 3    OB History    No data available       Home Medications    Prior to Admission medications   Medication Sig Start Date End Date Taking? Authorizing Provider  ALPRAZolam (XANAX) 0.25 MG tablet TAKE ONE-HALF TO ONE TABLET BY MOUTH UP TO THREE TIMES DAILY AS NEEDED FOR ANXIETY 03/23/16   Sunnie Nielsen, DO  bisoprolol-hydrochlorothiazide [facility]) 5-6.25 MG tablet Take 0.5 tablets by mouth daily. 11/02/15   Sunnie Nielsen, DO  HYDROcodone-acetaminophen (NORCO/VICODIN) 5-325 MG tablet Take one by mouth at bedtime as needed for pain 06/03/16   Lattie Haw, MD  predniSONE (DELTASONE) 20 MG tablet Take one tab by mouth twice daily for 5 days, then one daily. Take with food. 06/03/16   Lattie Haw, MD  ranitidine (ZANTAC) 150 MG tablet Take 150 mg by mouth 2 (two) times daily.    Historical Provider, MD  tiZANidine (ZANAFLEX) 4 MG tablet Take 0.5-1 tablets (2-4 mg total) by mouth at bedtime. 11/02/15   Sunnie Nielsen, DO    Family History Family History  Problem Relation Age of Onset  . Cancer Mother     LEUKEMIA  . Hypertension Mother   . Cancer Father     LUNG    Social History Social History  Substance Use Topics  . Smoking status: Former Games developer  . Smokeless tobacco: Never Used  . Alcohol use No     Allergies   Review of patient's allergies indicates no known allergies.   Review of Systems Review of Systems  All other systems reviewed and are negative.    Physical Exam Triage Vital Signs ED Triage Vitals  Enc Vitals Group     BP 06/03/16 1459 152/90     Pulse Rate 06/03/16 1459 77     Resp --      Temp 06/03/16 1459 97.9 F (36.6 C)     Temp Source 06/03/16 1459 Oral     SpO2 06/03/16 1459 100 %     Weight 06/03/16 1500 154 lb (69.9 kg)     Height 06/03/16 1500 5\' 4"  (1.626 m)     Head Circumference --  Peak Flow --      Pain Score 06/03/16 1502 10     Pain Loc --      Pain Edu? --      Excl. in GC? --    No data found.   Updated Vital Signs BP 152/90 (BP Location: Left Arm)   Pulse 77   Temp 97.9 F (36.6 C) (Oral)   Ht 5\' 4"  (1.626 m)   Wt 154 lb (69.9 kg)   SpO2 100%   BMI 26.43 kg/m   Visual Acuity Right Eye Distance:   Left Eye Distance:   Bilateral Distance:    Right Eye Near:   Left Eye Near:    Bilateral Near:     Physical Exam  Constitutional: She appears well-developed and well-nourished. No distress.  HENT:  Head: Normocephalic.  Mouth/Throat: Oropharynx is clear and moist.  Eyes: Conjunctivae are normal. Pupils are equal, round, and reactive to light.  Cardiovascular: Normal rate.   Pulmonary/Chest: Effort normal.  Musculoskeletal:       Left foot: There is decreased range of motion, tenderness,  bony tenderness and swelling. There is normal capillary refill, no crepitus and no deformity.       Feet:  Left great toe is mildly erythematous, and tender to palpation.  Range of motion is decreased.   Neurological: She is alert.  Skin: Skin is warm and dry.  Nursing note and vitals reviewed.    UC Treatments / Results  Labs (all labs ordered are listed, but only abnormal results are displayed) Labs Reviewed - No data to display  EKG  EKG Interpretation None       Radiology No results found.  Procedures Procedures (including critical care time)  Medications Ordered in UC Medications - No data to display   Initial Impression / Assessment and Plan / UC Course  I have reviewed the triage vital signs and the nursing notes.  Pertinent labs & imaging results that were available during my care of the patient were reviewed by me and considered in my medical decision making (see chart for details).  Clinical Course  Begin prednisone burst/taper.  Rx for Lortab at bedtime. Increase fluid intake.  Elevate foot when possible. Followup with Family Doctor if not improved in about 5 days.     Final Clinical Impressions(s) / UC Diagnoses   Final diagnoses:  Acute idiopathic gout involving toe of left foot    New Prescriptions New Prescriptions   HYDROCODONE-ACETAMINOPHEN (NORCO/VICODIN) 5-325 MG TABLET    Take one by mouth at bedtime as needed for pain   PREDNISONE (DELTASONE) 20 MG TABLET    Take one tab by mouth twice daily for 5 days, then one daily. Take with food.     Lattie HawStephen A Traeh Milroy, MD 06/03/16 [phone]

## 2016-06-03 NOTE — Discharge Instructions (Signed)
Increase fluid intake.  Elevate foot when possible.

## 2016-06-03 NOTE — ED Triage Notes (Signed)
Left great toe pain x 2.5 days, HX Gout, 10/10

## 2016-06-08 ENCOUNTER — Telehealth: Payer: Self-pay | Admitting: *Deleted

## 2016-06-08 NOTE — Telephone Encounter (Signed)
Pt called reports that she is having side effects to prednisone. Is it okay to stop. She has taken 8 1/2 tabs. Per dr Georgina Pillionmassey it is ok to stop prednisone and f/u with her PCP if any problems with her gout reoccur. Pt agrees.

## 2016-06-27 ENCOUNTER — Other Ambulatory Visit: Payer: Self-pay | Admitting: Osteopathic Medicine

## 2016-06-27 DIAGNOSIS — F411 Generalized anxiety disorder: Secondary | ICD-10-CM

## 2016-10-24 ENCOUNTER — Telehealth: Payer: Self-pay | Admitting: Osteopathic Medicine

## 2016-10-24 DIAGNOSIS — Z0000 Encounter for general adult medical examination without abnormal findings: Secondary | ICD-10-CM

## 2016-10-24 NOTE — Telephone Encounter (Signed)
Patient called and scheduled physical for April 5th at 10am would like to get basic labs done downstairs the week before appt March 26, 27th or 28th. Thanks

## 2016-10-24 NOTE — Telephone Encounter (Signed)
Orders in 

## 2016-11-16 LAB — CBC WITH DIFFERENTIAL/PLATELET
BASOS ABS: 0 {cells}/uL (ref 0–200)
Basophils Relative: 0 %
EOS ABS: 285 {cells}/uL (ref 15–500)
Eosinophils Relative: 5 %
HEMATOCRIT: 38.2 % (ref 35.0–45.0)
HEMOGLOBIN: 12.4 g/dL (ref 11.7–15.5)
LYMPHS ABS: 2166 {cells}/uL (ref 850–3900)
Lymphocytes Relative: 38 %
MCH: 28.8 pg (ref 27.0–33.0)
MCHC: 32.5 g/dL (ref 32.0–36.0)
MCV: 88.6 fL (ref 80.0–100.0)
MPV: 9.8 fL (ref 7.5–12.5)
Monocytes Absolute: 399 cells/uL (ref 200–950)
Monocytes Relative: 7 %
NEUTROS ABS: 2850 {cells}/uL (ref 1500–7800)
Neutrophils Relative %: 50 %
Platelets: 236 10*3/uL (ref 140–400)
RBC: 4.31 MIL/uL (ref 3.80–5.10)
RDW: 13.7 % (ref 11.0–15.0)
WBC: 5.7 10*3/uL (ref 3.8–10.8)

## 2016-11-16 LAB — LIPID PANEL
Cholesterol: 193 mg/dL (ref <200)
HDL: 90 mg/dL (ref >50)
LDL Cholesterol: 91 mg/dL (ref <100)
TRIGLYCERIDES: 59 mg/dL (ref <150)
Total CHOL/HDL Ratio: 2.1 Ratio (ref <5.0)
VLDL: 12 mg/dL (ref <30)

## 2016-11-16 LAB — COMPLETE METABOLIC PANEL WITH GFR
ALK PHOS: 35 U/L (ref 33–130)
ALT: 17 U/L (ref 6–29)
AST: 22 U/L (ref 10–35)
Albumin: 4.4 g/dL (ref 3.6–5.1)
BILIRUBIN TOTAL: 0.3 mg/dL (ref 0.2–1.2)
BUN: 19 mg/dL (ref 7–25)
CO2: 28 mmol/L (ref 20–31)
Calcium: 9.4 mg/dL (ref 8.6–10.4)
Chloride: 103 mmol/L (ref 98–110)
Creat: 0.74 mg/dL (ref 0.50–1.05)
GFR, EST NON AFRICAN AMERICAN: 89 mL/min (ref >=60)
Glucose, Bld: 100 mg/dL — ABNORMAL HIGH (ref 65–99)
Potassium: 4.1 mmol/L (ref 3.5–5.3)
Sodium: 140 mmol/L (ref 135–146)
TOTAL PROTEIN: 6.5 g/dL (ref 6.1–8.1)

## 2016-11-16 LAB — VITAMIN D 25 HYDROXY (VIT D DEFICIENCY, FRACTURES): VIT D 25 HYDROXY: 29 ng/mL — AB (ref 30–100)

## 2016-11-16 LAB — HIV ANTIBODY (ROUTINE TESTING W REFLEX): HIV 1&2 Ab, 4th Generation: NONREACTIVE

## 2016-11-16 LAB — HEPATITIS C ANTIBODY: HCV Ab: NEGATIVE

## 2016-11-16 LAB — TSH: TSH: 2.97 m[IU]/L

## 2016-11-24 ENCOUNTER — Encounter: Payer: Self-pay | Admitting: Osteopathic Medicine

## 2016-11-24 ENCOUNTER — Ambulatory Visit (INDEPENDENT_AMBULATORY_CARE_PROVIDER_SITE_OTHER): Payer: BLUE CROSS/BLUE SHIELD | Admitting: Osteopathic Medicine

## 2016-11-24 VITALS — BP 133/84 | HR 67 | Ht 64.0 in | Wt 159.0 lb

## 2016-11-24 DIAGNOSIS — Z1231 Encounter for screening mammogram for malignant neoplasm of breast: Secondary | ICD-10-CM

## 2016-11-24 DIAGNOSIS — I10 Essential (primary) hypertension: Secondary | ICD-10-CM | POA: Diagnosis not present

## 2016-11-24 DIAGNOSIS — Z8719 Personal history of other diseases of the digestive system: Secondary | ICD-10-CM | POA: Insufficient documentation

## 2016-11-24 DIAGNOSIS — Z0000 Encounter for general adult medical examination without abnormal findings: Secondary | ICD-10-CM | POA: Diagnosis not present

## 2016-11-24 DIAGNOSIS — Z1239 Encounter for other screening for malignant neoplasm of breast: Secondary | ICD-10-CM

## 2016-11-24 MED ORDER — BISOPROLOL-HYDROCHLOROTHIAZIDE 5-6.25 MG PO TABS: 1.0000 | ORAL_TABLET | Freq: Every day | ORAL | 3 refills | Status: DC

## 2016-11-24 MED ORDER — ESTRADIOL 0.1 MG/GM VA CREA: 1.0000 | TOPICAL_CREAM | Freq: Every day | VAGINAL | 11 refills | Status: DC

## 2016-11-24 NOTE — Patient Instructions (Addendum)
Calcium - 1300-1500 mg daily Vitamin D - [phone] units daily

## 2016-11-24 NOTE — Progress Notes (Signed)
HPI: Meghan Hubbard is a 61 y.o. female  who presents to [place] today, 11/24/16,  for chief complaint of:  Chief Complaint  Patient presents with  . Annual Exam    See below for review of preventive care.  Hypertension: Requests refill on medications. Wants to know if okay to go back up to 1 tablet daily for better blood pressure control.    Past medical, surgical, social and family history reviewed: Patient Active Problem List   Diagnosis Date Noted  . Muscle pain 11/03/2015  . Arthritis 11/03/2015  . Microscopic hematuria 11/03/2015  . Essential hypertension 09/24/2015  . GERD (gastroesophageal reflux disease) 09/24/2015  . Anxiety state 09/24/2015   Past Surgical History:  Procedure Laterality Date  . CERVICAL FUSION    . FOOT SURGERY     x 3   Social History  Substance Use Topics  . Smoking status: Former Research scientist (life sciences)  . Smokeless tobacco: Never Used  . Alcohol use No   Family History  Problem Relation Age of Onset  . Cancer Mother     LEUKEMIA  . Hypertension Mother   . Cancer Father     LUNG     Current medication list and allergy/intolerance information reviewed:   Current Outpatient Prescriptions  Medication Sig Dispense Refill  . ALPRAZolam (XANAX) 0.25 MG tablet Take 1 tablet (0.25 mg total) by mouth 3 (three) times daily as needed for anxiety. APPOINTMENT NEEDED FOR FURTHER REFILLS 30 tablet 0  . bisoprolol-hydrochlorothiazide (ZIAC) 5-6.25 MG tablet Take 0.5 tablets by mouth daily. 90 tablet 1  . HYDROcodone-acetaminophen (NORCO/VICODIN) 5-325 MG tablet Take one by mouth at bedtime as needed for pain 10 tablet 0  . predniSONE (DELTASONE) 20 MG tablet Take one tab by mouth twice daily for 5 days, then one daily. Take with food. 14 tablet 0  . ranitidine (ZANTAC) 150 MG tablet Take 150 mg by mouth 2 (two) times daily.    Marland Kitchen tiZANidine (ZANAFLEX) 4 MG tablet Take 0.5-1 tablets (2-4 mg total) by mouth at bedtime. 90  tablet 1   No current facility-administered medications for this visit.    No Known Allergies    Review of Systems:  Constitutional:  No  fever, no chills, No recent illness, No unintentional weight changes. No significant fatigue.   HEENT: No  headache, no vision change, no hearing change, No sore throat, No  sinus pressure  Cardiac: No  chest pain, No  pressure, No palpitations, No  Orthopnea  Respiratory:  No  shortness of breath. No  Cough  Gastrointestinal: No  abdominal pain, No  nausea, No  vomiting,  No  blood in stool, No  diarrhea, No  constipation   Musculoskeletal: No new myalgia/arthralgia  Genitourinary: No  incontinence, No  abnormal genital bleeding, No abnormal genital discharge  Skin: No  Rash, No other wounds/concerning lesions  Hem/Onc: No  easy bruising/bleeding, No  abnormal lymph node  Endocrine: No cold intolerance,  No heat intolerance. No polyuria/polydipsia/polyphagia   Neurologic: No  weakness, No  dizziness, No  slurred speech/focal weakness/facial droop  Psychiatric: No  concerns with depression, No  concerns with anxiety, No sleep problems, No mood problems  Exam:  BP 133/84   Pulse 67   Ht '5\' 4"'$  (1.626 m)   Wt 159 lb (72.1 kg)   BMI 27.29 kg/m   Constitutional: VS see above. General Appearance: alert, well-developed, well-nourished, NAD  Eyes: Normal lids and conjunctive, non-icteric sclera  Ears, Nose,  Mouth, Throat: MMM, Normal external inspection ears/nares/mouth/lips/gums. TM normal bilaterally. Pharynx/tonsils no erythema, no exudate. Nasal mucosa normal.   Neck: No masses, trachea midline. No thyroid enlargement. No tenderness/mass appreciated. No lymphadenopathy  Respiratory: Normal respiratory effort. no wheeze, no rhonchi, no rales  Cardiovascular: S1/S2 normal, no murmur, no rub/gallop auscultated. RRR. No lower extremity edema.  Gastrointestinal: Nontender, no masses. No hepatomegaly, no splenomegaly. No hernia  appreciated. Bowel sounds normal. Rectal exam deferred.   Musculoskeletal: Gait normal. No clubbing/cyanosis of digits.   Neurological: Normal balance/coordination. No tremor. No cranial nerve deficit on limited exam.  Skin: warm, dry, intact. No rash/ulcer. No concerning nevi or subq nodules on limited exam.    Psychiatric: Normal judgment/insight. Normal mood and affect. Oriented x3.    Recent Results (from the past 2160 hour(s))  CBC with Differential/Platelet     Status: None   Collection Time: 11/15/16  4:17 PM  Result Value Ref Range   WBC 5.7 3.8 - 10.8 K/uL   RBC 4.31 3.80 - 5.10 MIL/uL   Hemoglobin 12.4 11.7 - 15.5 g/dL   HCT 38.2 35.0 - 45.0 %   MCV 88.6 80.0 - 100.0 fL   MCH 28.8 27.0 - 33.0 pg   MCHC 32.5 32.0 - 36.0 g/dL   RDW 13.7 11.0 - 15.0 %   Platelets 236 140 - 400 K/uL   MPV 9.8 7.5 - 12.5 fL   Neutro Abs 2,850 1,500 - 7,800 cells/uL   Lymphs Abs 2,166 850 - 3,900 cells/uL   Monocytes Absolute 399 200 - 950 cells/uL   Eosinophils Absolute 285 15 - 500 cells/uL   Basophils Absolute 0 0 - 200 cells/uL   Neutrophils Relative % 50 %   Lymphocytes Relative 38 %   Monocytes Relative 7 %   Eosinophils Relative 5 %   Basophils Relative 0 %   Smear Review Criteria for review not met   COMPLETE METABOLIC PANEL WITH GFR     Status: Abnormal   Collection Time: 11/15/16  4:17 PM  Result Value Ref Range   Sodium 140 135 - 146 mmol/L   Potassium 4.1 3.5 - 5.3 mmol/L   Chloride 103 98 - 110 mmol/L   CO2 28 20 - 31 mmol/L   Glucose, Bld 100 (H) 65 - 99 mg/dL   BUN 19 7 - 25 mg/dL   Creat 0.74 0.50 - 1.05 mg/dL    Comment:   For patients > or = 60 years of age: The upper reference limit for Creatinine is approximately 13% higher for people identified as African-American.      Total Bilirubin 0.3 0.2 - 1.2 mg/dL   Alkaline Phosphatase 35 33 - 130 U/L   AST 22 10 - 35 U/L   ALT 17 6 - 29 U/L   Total Protein 6.5 6.1 - 8.1 g/dL   Albumin 4.4 3.6 - 5.1 g/dL    Calcium 9.4 8.6 - 10.4 mg/dL   GFR, Est African American >89 >=60 mL/min   GFR, Est Non African American 89 >=60 mL/min  Lipid panel     Status: None   Collection Time: 11/15/16  4:17 PM  Result Value Ref Range   Cholesterol 193 <200 mg/dL   Triglycerides 59 <150 mg/dL   HDL 90 >50 mg/dL   Total CHOL/HDL Ratio 2.1 <5.0 Ratio   VLDL 12 <30 mg/dL   LDL Cholesterol 91 <100 mg/dL  TSH     Status: None   Collection Time: 11/15/16  4:17 PM  Result Value Ref Range   TSH 2.97 mIU/L    Comment:   Reference Range   > or = 20 Years  0.40-4.50   Pregnancy Range First trimester  0.26-2.66 Second trimester 0.55-2.73 Third trimester  0.43-2.91     HIV antibody     Status: None   Collection Time: 11/15/16  4:17 PM  Result Value Ref Range   HIV 1&2 Ab, 4th Generation NONREACTIVE NONREACTIVE    Comment:   HIV-1 antigen and HIV-1/HIV-2 antibodies were not detected.  There is no laboratory evidence of HIV infection.   HIV-1/2 Antibody Diff        Not indicated. HIV-1 RNA, Qual TMA          Not indicated.     PLEASE NOTE: This information has been disclosed to you from records whose confidentiality may be protected by state law. If your state requires such protection, then the state law prohibits you from making any further disclosure of the information without the specific written consent of the person to whom it pertains, or as otherwise permitted by law. A general authorization for the release of medical or other information is NOT sufficient for this purpose.   The performance of this assay has not been clinically validated in patients less than 13 years old.   For additional information please refer to http://education.[facility].com/faq/FAQ106.  (This link is being provided for informational/educational purposes only.)     Hepatitis C antibody     Status: None   Collection Time: 11/15/16  4:17 PM  Result Value Ref Range   HCV Ab NEGATIVE NEGATIVE  VITAMIN D 25 Hydroxy  (Vit-D Deficiency, Fractures)     Status: Abnormal   Collection Time: 11/15/16  4:17 PM  Result Value Ref Range   Vit D, 25-Hydroxy 29 (L) 30 - 100 ng/mL    Comment: Vitamin D Status           25-OH Vitamin D        Deficiency                <20 ng/mL        Insufficiency         20 - 29 ng/mL        Optimal             > or = 30 ng/mL   For 25-OH Vitamin D testing on patients on D2-supplementation and patients for whom quantitation of D2 and D3 fractions is required, the [facility] 25-OH VIT D, (D2,D3), LC/MS/MS is recommended: order code 59015 (patients > 2 yrs).      ASSESSMENT/PLAN:   Annual physical exam  Essential hypertension - Plan: bisoprolol-hydrochlorothiazide (ZIAC) 5-6.25 MG tablet, DISCONTINUED: bisoprolol-hydrochlorothiazide (ZIAC) 5-6.25 MG tablet, DISCONTINUED: bisoprolol-hydrochlorothiazide (ZIAC) 5-6.25 MG tablet  Breast cancer screening - Plan: MM DIGITAL SCREENING BILATERAL   FEMALE PREVENTIVE CARE Updated 11/24/16   ANNUAL SCREENING/COUNSELING  Diet/Exercise - HEALTHY HABITS DISCUSSED TO DECREASE CV RISK History  Smoking Status  . Former Smoker  Smokeless Tobacco  . Never Used   History  Alcohol Use No   Depression screen PHQ 2/9 11/24/2016  Decreased Interest 0  Down, Depressed, Hopeless 0  PHQ - 2 Score 0   Domestic violence concerns - no  HTN SCREENING - SEE VITALS  SEXUAL HEALTH  Sexually active in the past year - Yes with female.  Need/want STI testing today? - no  Concerns about libido or pain with sex? - yes - requests get back on  Estrace  Plans for pregnancy? - postpenopausal  INFECTIOUS DISEASE SCREENING  HIV - does not need  GC/CT - does not need  HepC - DOB [birth year] - does not need  TB - does not need  DISEASE SCREENING  Lipid - does not need  DM2 - does not need  Osteoporosis - women age 57+ - does not need  Fracture after age 64? no  Chronic steroid use? no  Smoking/Alcohol? no  Low body weight  <127lb? no  Hip fracture in parent? no  Premature menopause, malabsorbtion, CLD, IBD, RA? no  CANCER SCREENING  Cervical - does not need  Breast - needs  Lung - does not need  Colon - does not need  ADULT VACCINATION  Influenza - annual vaccine recommended  Td - booster every 10 years   Zoster - option at 50, yes at 60+   PCV13 - was not indicated  PPSV23 - was not indicated Immunization History  Administered Date(s) Administered  . Influenza,inj,Quad PF,36+ Mos 11/02/2015  . Tdap 12/20/2013      Patient Instructions  Calcium - 1300-1500 mg daily Vitamin D - [phone] units daily     Visit summary with medication list and pertinent instructions was printed for patient to review. All questions at time of visit were answered - patient instructed to contact office with any additional concerns. ER/RTC precautions were reviewed with the patient. Follow-up plan: Return for nurse visit BP check in 2-3 weeks, after that, annually/as needed .

## 2016-11-25 ENCOUNTER — Other Ambulatory Visit: Payer: Self-pay

## 2016-11-25 MED ORDER — ESTRADIOL 0.1 MG/GM VA CREA: 1.0000 | TOPICAL_CREAM | Freq: Every day | VAGINAL | 11 refills | Status: DC

## 2016-11-25 NOTE — Telephone Encounter (Signed)
Refill request for Estrace 0.1 mg. Request was approved and sent to [facility]. Magdelene Ruark,CMA

## 2016-12-06 ENCOUNTER — Other Ambulatory Visit: Payer: Self-pay

## 2016-12-06 DIAGNOSIS — I10 Essential (primary) hypertension: Secondary | ICD-10-CM

## 2016-12-06 MED ORDER — BISOPROLOL-HYDROCHLOROTHIAZIDE 5-6.25 MG PO TABS: 1.0000 | ORAL_TABLET | Freq: Every day | ORAL | 0 refills | Status: DC

## 2016-12-06 NOTE — Telephone Encounter (Signed)
Patient request refill for Ziac. 5-6.25 mg, # 30 0 refills sent to [facility]; patient  Needs  appointment for a nurse  visit to recheck blood pressure. Rhonda Cunningham,CMA\

## 2016-12-08 ENCOUNTER — Telehealth: Payer: Self-pay

## 2016-12-08 DIAGNOSIS — I10 Essential (primary) hypertension: Secondary | ICD-10-CM

## 2016-12-08 MED ORDER — BISOPROLOL-HYDROCHLOROTHIAZIDE 5-6.25 MG PO TABS: 1.0000 | ORAL_TABLET | Freq: Every day | ORAL | 0 refills | Status: DC

## 2016-12-08 NOTE — Telephone Encounter (Signed)
BLOOD PRESSURE MEDICATION WAS SENT TO WRONG PHARMACY. Rhonda Cunningham,CMA

## 2016-12-20 ENCOUNTER — Ambulatory Visit (INDEPENDENT_AMBULATORY_CARE_PROVIDER_SITE_OTHER): Payer: BLUE CROSS/BLUE SHIELD

## 2016-12-20 ENCOUNTER — Other Ambulatory Visit: Payer: Self-pay | Admitting: Osteopathic Medicine

## 2016-12-20 DIAGNOSIS — Z1231 Encounter for screening mammogram for malignant neoplasm of breast: Secondary | ICD-10-CM

## 2016-12-20 DIAGNOSIS — M791 Myalgia, unspecified site: Secondary | ICD-10-CM

## 2017-01-31 IMAGING — CR DG CHEST 2V
2 series · 2 of 2 positions shown · non-contrast
Comparison: None.

CLINICAL DATA: Mid chest heaviness with heartburn for 2 weeks. Near
syncope today. Former smoker.

EXAM:
CHEST  2 VIEW

[w chest pa]
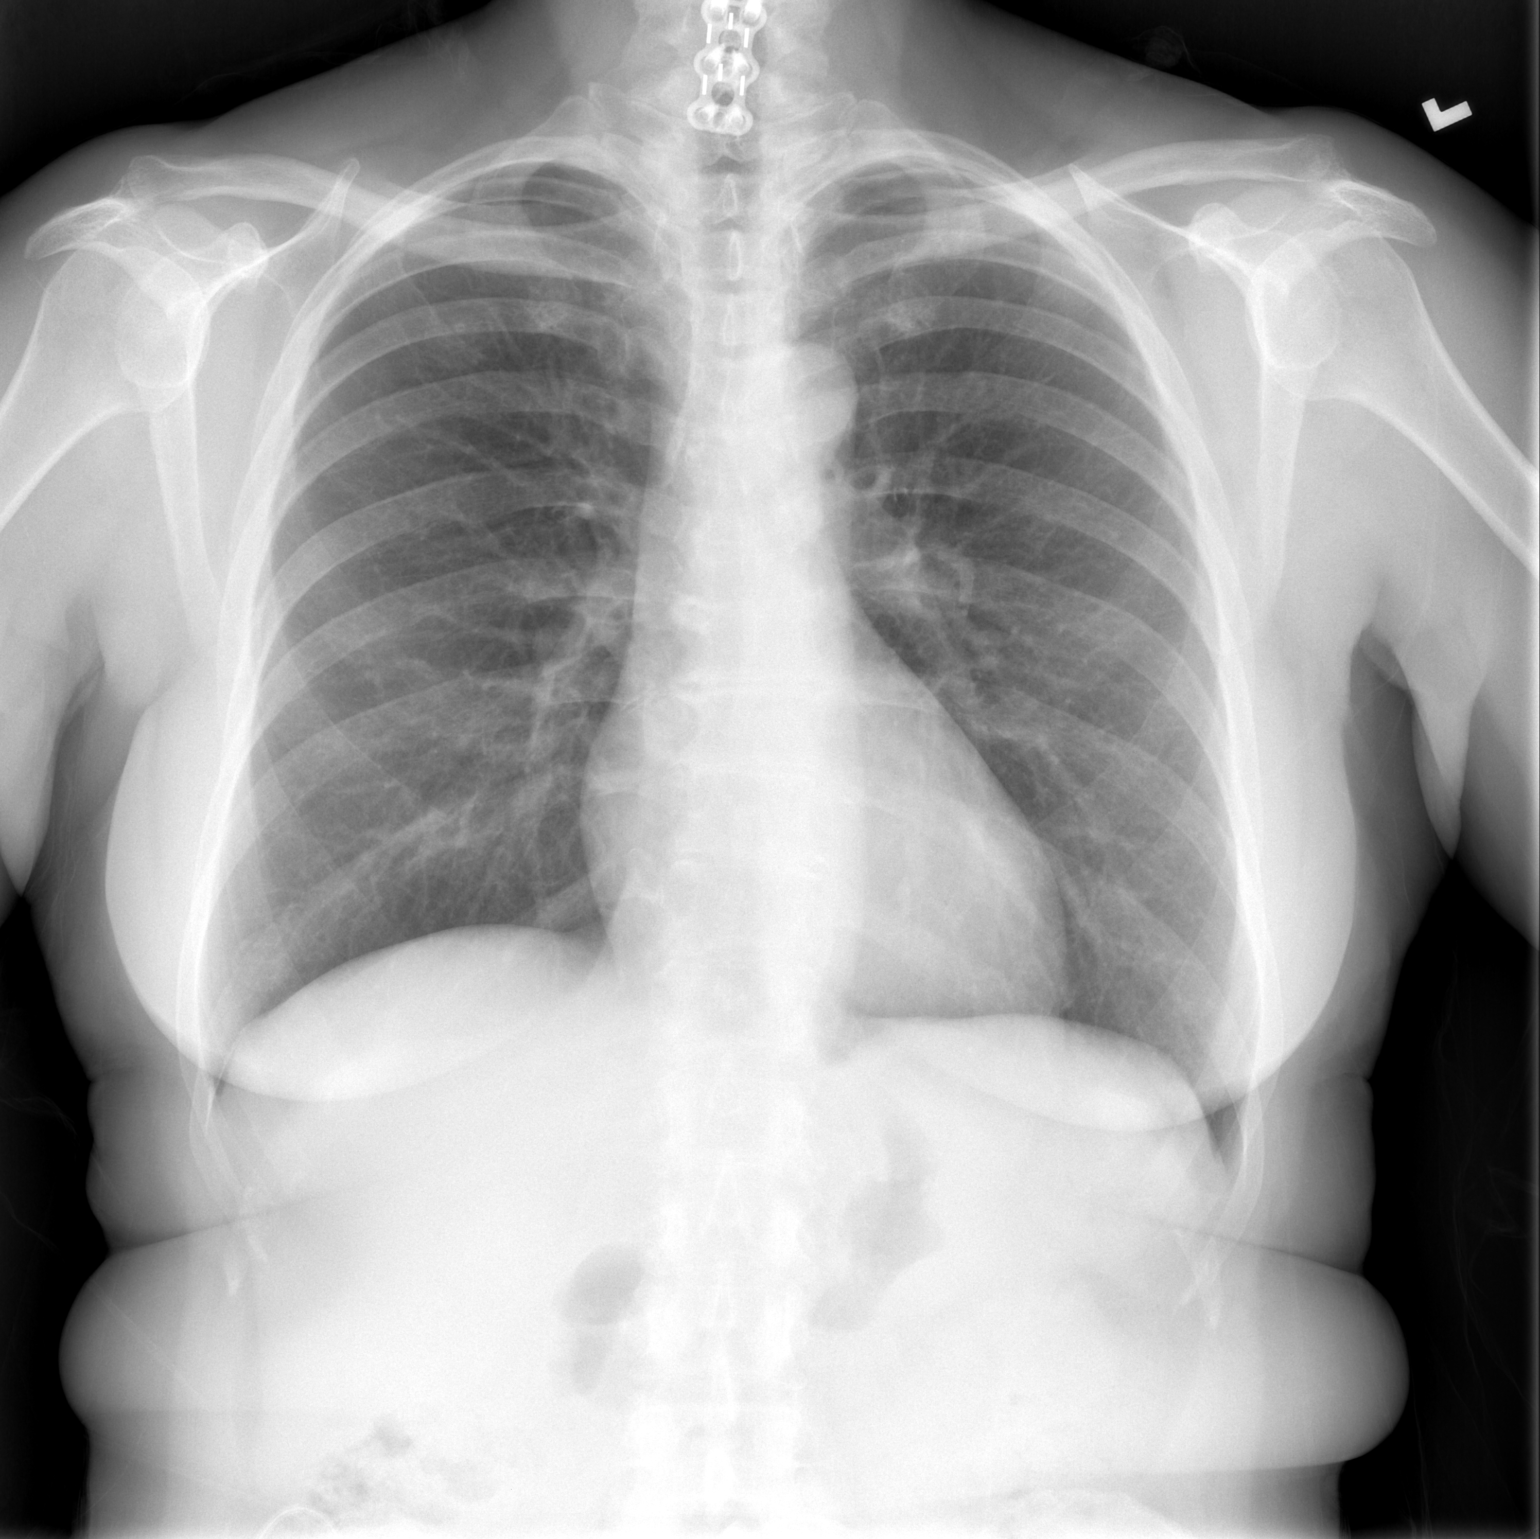

[w chest lat]
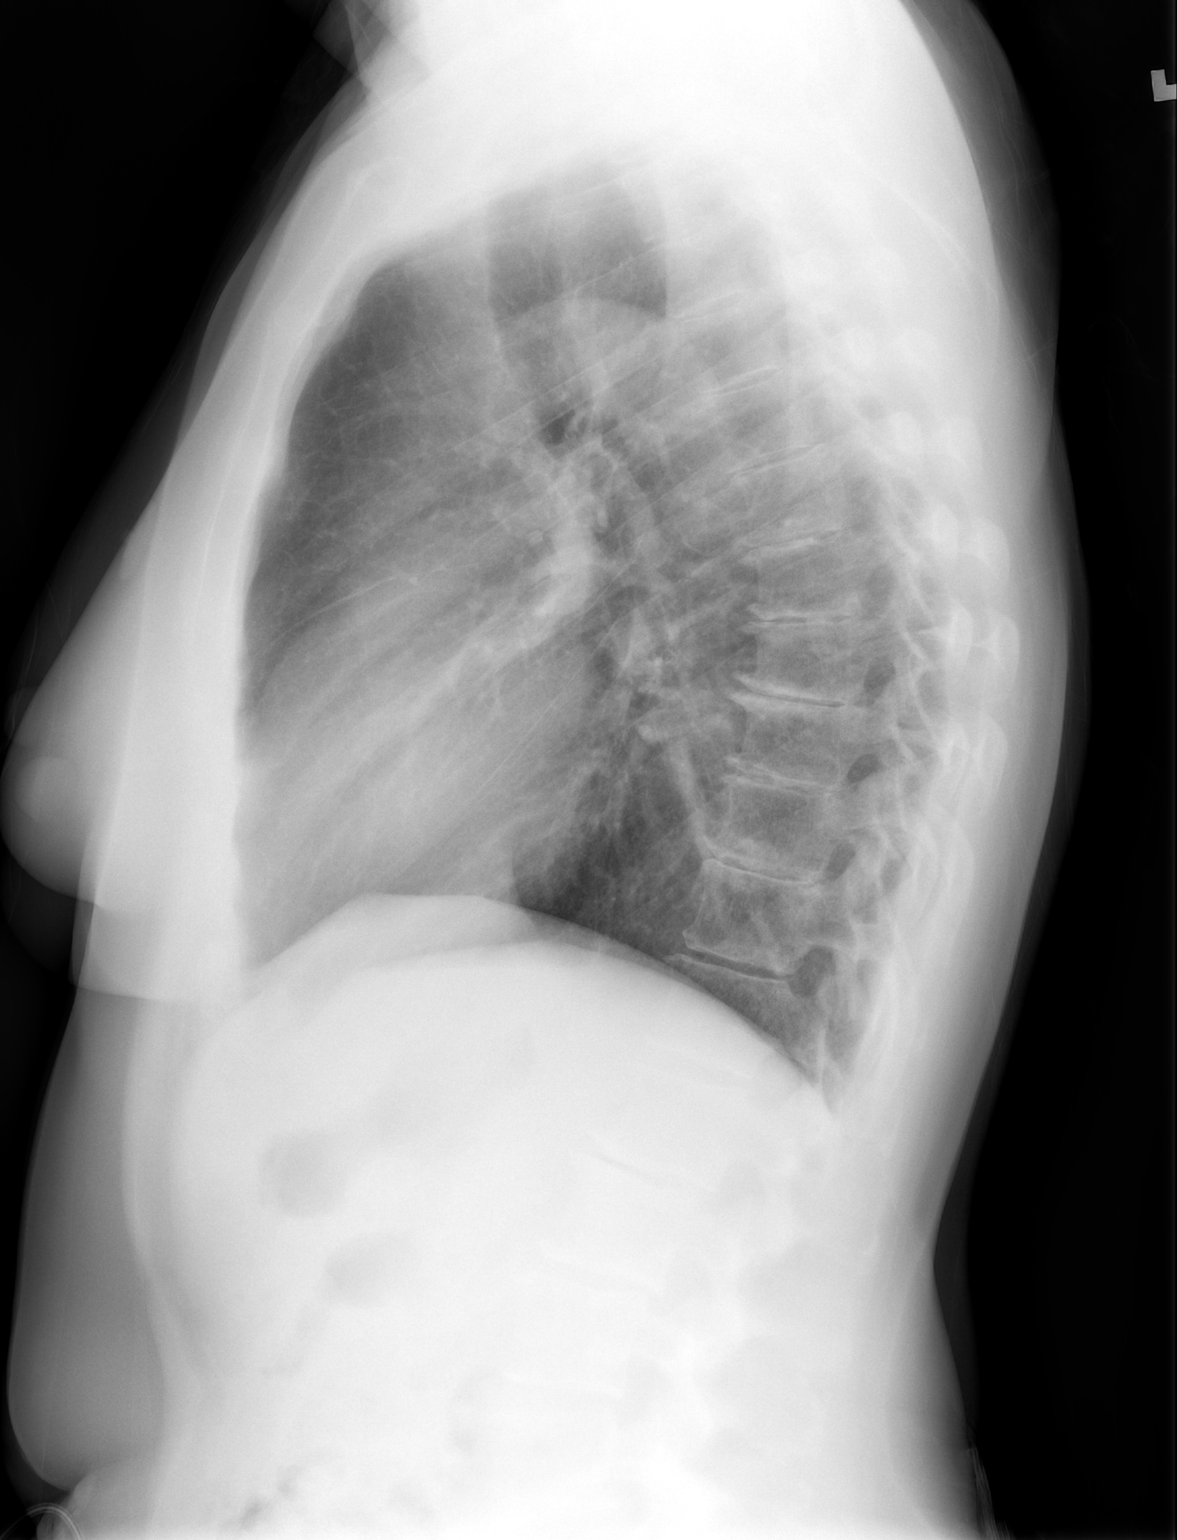

[2 of 2 positions shown; findings below may reference images not displayed]

FINDINGS: Normal heart size and pulmonary vascularity. No focal airspace
disease or consolidation in the lungs. No blunting of costophrenic
angles. No pneumothorax. Mediastinal contours appear intact.
Postoperative changes in the cervical spine. Mild degenerative
changes in the thoracic spine.
IMPRESSION: No active cardiopulmonary disease.

## 2017-03-02 ENCOUNTER — Other Ambulatory Visit: Payer: Self-pay | Admitting: Osteopathic Medicine

## 2017-03-02 DIAGNOSIS — M1990 Unspecified osteoarthritis, unspecified site: Secondary | ICD-10-CM

## 2017-03-02 DIAGNOSIS — F411 Generalized anxiety disorder: Secondary | ICD-10-CM

## 2017-03-03 NOTE — Telephone Encounter (Signed)
OK to refill, has been a good while since Xanax refill. She was advised to come in for nurse visit BP check, we had talked about going up on her BP meds

## 2017-03-03 NOTE — Telephone Encounter (Signed)
Spoke with Pt, she will call back and schedule a nurse visit BP check.

## 2017-03-21 ENCOUNTER — Ambulatory Visit: Payer: Self-pay

## 2017-03-28 ENCOUNTER — Ambulatory Visit (INDEPENDENT_AMBULATORY_CARE_PROVIDER_SITE_OTHER): Payer: BLUE CROSS/BLUE SHIELD | Admitting: Osteopathic Medicine

## 2017-03-28 VITALS — BP 105/59 | HR 72 | Wt 154.0 lb

## 2017-03-28 DIAGNOSIS — I10 Essential (primary) hypertension: Secondary | ICD-10-CM

## 2017-03-28 NOTE — Progress Notes (Signed)
   Subjective:    Patient ID: Meghan Hubbard, female    DOB: 12/16/1955, 61 y.o.   MRN: [id number]  HPI Pt is in today for a blood pressure check. Pt is tolerating bisoprolol-hydrochlorothiazide with no problems.      Review of Systems     Objective:   Physical Exam        Assessment & Plan:

## 2017-03-29 NOTE — Progress Notes (Signed)
BP (!) 105/59   Pulse 72   Wt 154 lb (69.9 kg)   BMI 26.43 kg/m   Blood pressure on the low side, as long as she is not having symptoms of dizziness or lightheadedness or weakness, okay to continue at this but if she experiences symptoms we'll want to back off to one half tablet of the bisoprolol-hydrochlorothiazide daily.

## 2017-03-29 NOTE — Progress Notes (Signed)
Pt notified of recommendations

## 2017-05-31 ENCOUNTER — Other Ambulatory Visit: Payer: Self-pay | Admitting: Osteopathic Medicine

## 2017-05-31 DIAGNOSIS — M1990 Unspecified osteoarthritis, unspecified site: Secondary | ICD-10-CM

## 2017-09-27 ENCOUNTER — Other Ambulatory Visit: Payer: Self-pay

## 2017-09-27 DIAGNOSIS — M791 Myalgia, unspecified site: Secondary | ICD-10-CM

## 2017-09-27 MED ORDER — TIZANIDINE HCL 4 MG PO TABS: 2.0000 mg | ORAL_TABLET | Freq: Every day | ORAL | 1 refills | Status: DC

## 2017-10-06 ENCOUNTER — Other Ambulatory Visit: Payer: Self-pay | Admitting: Osteopathic Medicine

## 2017-10-06 DIAGNOSIS — M1990 Unspecified osteoarthritis, unspecified site: Secondary | ICD-10-CM

## 2017-11-17 ENCOUNTER — Other Ambulatory Visit: Payer: Self-pay | Admitting: Osteopathic Medicine

## 2017-11-17 DIAGNOSIS — Z1231 Encounter for screening mammogram for malignant neoplasm of breast: Secondary | ICD-10-CM

## 2017-11-20 ENCOUNTER — Encounter: Payer: Self-pay | Admitting: Osteopathic Medicine

## 2017-11-27 ENCOUNTER — Ambulatory Visit (INDEPENDENT_AMBULATORY_CARE_PROVIDER_SITE_OTHER): Payer: BLUE CROSS/BLUE SHIELD | Admitting: Osteopathic Medicine

## 2017-11-27 ENCOUNTER — Encounter: Payer: Self-pay | Admitting: Osteopathic Medicine

## 2017-11-27 VITALS — BP 137/85 | HR 59 | Temp 98.4°F | Wt 164.4 lb

## 2017-11-27 DIAGNOSIS — M1990 Unspecified osteoarthritis, unspecified site: Secondary | ICD-10-CM

## 2017-11-27 DIAGNOSIS — I10 Essential (primary) hypertension: Secondary | ICD-10-CM

## 2017-11-27 DIAGNOSIS — F411 Generalized anxiety disorder: Secondary | ICD-10-CM

## 2017-11-27 DIAGNOSIS — M791 Myalgia, unspecified site: Secondary | ICD-10-CM

## 2017-11-27 DIAGNOSIS — K219 Gastro-esophageal reflux disease without esophagitis: Secondary | ICD-10-CM

## 2017-11-27 DIAGNOSIS — R7301 Impaired fasting glucose: Secondary | ICD-10-CM

## 2017-11-27 DIAGNOSIS — Z0000 Encounter for general adult medical examination without abnormal findings: Secondary | ICD-10-CM

## 2017-11-27 DIAGNOSIS — N952 Postmenopausal atrophic vaginitis: Secondary | ICD-10-CM

## 2017-11-27 MED ORDER — MELOXICAM 15 MG PO TABS: 15.0000 mg | ORAL_TABLET | Freq: Every day | ORAL | 3 refills | Status: AC

## 2017-11-27 MED ORDER — BISOPROLOL-HYDROCHLOROTHIAZIDE 5-6.25 MG PO TABS: 1.0000 | ORAL_TABLET | Freq: Every day | ORAL | 3 refills | Status: DC

## 2017-11-27 MED ORDER — ESTRADIOL 0.1 MG/GM VA CREA: 1.0000 | TOPICAL_CREAM | Freq: Every day | VAGINAL | 11 refills | Status: AC

## 2017-11-27 MED ORDER — TIZANIDINE HCL 4 MG PO TABS: 2.0000 mg | ORAL_TABLET | Freq: Every day | ORAL | 3 refills | Status: AC

## 2017-11-27 NOTE — Patient Instructions (Signed)
Try Zantac 150 mg - 300 mg nightly for a month or two, use Tums as needed for flares during the day

## 2017-11-27 NOTE — Progress Notes (Signed)
HPI: Meghan DexterBrenda D Hubbard is a 62 y.o. female  who presents to [facility] today, 11/27/17,  for chief complaint of:  Annual physical    See below for review of preventive care.  Stomach bloating especially with working out. Zantac when it happens.    HTN: needs refill medication. No SOB/CP, no HA/VC   Vaginal dryness postmenopausal, inquires about estrogen cream a friend is taking    Past medical, surgical, social and family history reviewed: Patient Active Problem List   Diagnosis Date Noted  . History of colonic diverticulitis 11/24/2016  . Muscle pain 11/03/2015  . Arthritis 11/03/2015  . Microscopic hematuria 11/03/2015  . Essential hypertension 09/24/2015  . GERD (gastroesophageal reflux disease) 09/24/2015  . Anxiety state 09/24/2015   Past Surgical History:  Procedure Laterality Date  . CERVICAL FUSION    . FOOT SURGERY     x 3   Social History   Tobacco Use  . Smoking status: Former Games developermoker  . Smokeless tobacco: Never Used  Substance Use Topics  . Alcohol use: No   Family History  Problem Relation Age of Onset  . Cancer Mother        LEUKEMIA  . Hypertension Mother   . Cancer Father        LUNG     Current medication list and allergy/intolerance information reviewed:   Current Outpatient Medications  Medication Sig Dispense Refill  . ALPRAZolam (XANAX) 0.25 MG tablet TAKE ONE TABLET BY MOUTH THREE TIMES DAILY AS NEEDED FOR ANXIETY 30 tablet 0  . bisoprolol-hydrochlorothiazide (ZIAC) 5-6.25 MG tablet Take 1 tablet by mouth daily. PATIENT NEEDS APPOINTMENT 90 tablet 0  . meloxicam (MOBIC) 15 MG tablet TAKE 1/2 TO 1 TABLET(7.5 TO 15 MG) BY MOUTH DAILY 90 tablet 0  . tiZANidine (ZANAFLEX) 4 MG tablet Take 0.5-1 tablets (2-4 mg total) by mouth at bedtime. 90 tablet 1  . estradiol (ESTRACE VAGINAL) 0.1 MG/GM vaginal cream Place 1 Applicatorful vaginally at bedtime. Daily for 1-2 weeks, then decrease to 2-3 times per week  (Patient not taking: Reported on 11/27/2017) 42.5 g 11   No current facility-administered medications for this visit.    No Known Allergies    Review of Systems:  Constitutional:  No  fever, no chills, No recent illness, No unintentional weight changes. No significant fatigue.   HEENT: No  headache, no vision change, no hearing change, No sore throat, No  sinus pressure  Cardiac: No  chest pain, No  pressure, No palpitations, No  Orthopnea  Respiratory:  No  shortness of breath. No  Cough  Gastrointestinal: +abdominal pain as per HPI, No  nausea, No  vomiting,  No  blood in stool, No  diarrhea, No  constipation   Musculoskeletal: No new myalgia/arthralgia  Genitourinary: No  abnormal genital bleeding, No abnormal genital discharge  Skin: No  Rash, No other wounds/concerning lesions  Hem/Onc: No  easy bruising/bleeding, No  abnormal lymph node  Endocrine: No cold intolerance,  No heat intolerance. No polyuria/polydipsia/polyphagia   Neurologic: No  weakness, No  dizziness, No  slurred speech/focal weakness/facial droop  Psychiatric: No  concerns with depression, No  concerns with anxiety, No sleep problems, No mood problems  Exam:  BP 137/85 (BP Location: Left Arm, Patient Position: Sitting, Cuff Size: Normal)   Pulse (!) 59   Temp 98.4 F (36.9 C) (Oral)   Wt 164 lb 6.4 oz (74.6 kg)   BMI 28.22 kg/m   Constitutional: VS see  above. General Appearance: alert, well-developed, well-nourished, NAD  Eyes: Normal lids and conjunctive, non-icteric sclera  Ears, Nose, Mouth, Throat: MMM, Normal external inspection ears/nares/mouth/lips/gums. TM normal bilaterally. Pharynx/tonsils no erythema, no exudate. Nasal mucosa normal.   Neck: No masses, trachea midline. No thyroid enlargement. No tenderness/mass appreciated. No lymphadenopathy  Respiratory: Normal respiratory effort. no wheeze, no rhonchi, no rales  Cardiovascular: S1/S2 normal, no murmur, no rub/gallop auscultated.  RRR. No lower extremity edema.  Gastrointestinal: Nontender, no masses. No hepatomegaly, no splenomegaly. No hernia appreciated. Bowel sounds normal. Rectal exam deferred.   Musculoskeletal: Gait normal. No clubbing/cyanosis of digits.   Neurological: Normal balance/coordination. No tremor. No cranial nerve deficit on limited exam.  Skin: warm, dry, intact. No rash/ulcer. No concerning nevi or subq nodules on limited exam.    Psychiatric: Normal judgment/insight. Normal mood and affect. Oriented x3.    No results found for this or any previous visit (from the past 2160 hour(s)).   ASSESSMENT/PLAN:   Annual physical exam - Plan: CBC, COMPLETE METABOLIC PANEL WITH GFR, Lipid panel  Essential hypertension - Plan: CBC, COMPLETE METABOLIC PANEL WITH GFR, Lipid panel, bisoprolol-hydrochlorothiazide (ZIAC) 5-6.25 MG tablet  Anxiety state  Arthritis - Plan: meloxicam (MOBIC) 15 MG tablet  Muscle pain - Plan: tiZANidine (ZANAFLEX) 4 MG tablet  Postmenopause atrophic vaginitis - Plan: estradiol (ESTRACE VAGINAL) 0.1 MG/GM vaginal cream  Gastroesophageal reflux disease, esophagitis presence not specified   FEMALE PREVENTIVE CARE Updated 11/27/17   ANNUAL SCREENING/COUNSELING  Diet/Exercise - HEALTHY HABITS DISCUSSED TO DECREASE CV RISK Social History   Tobacco Use  Smoking Status Former Smoker  Smokeless Tobacco Never Used   Social History   Substance and Sexual Activity  Alcohol Use No   Depression screen PHQ 2/9 11/24/2016  Decreased Interest 0  Down, Depressed, Hopeless 0  PHQ - 2 Score 0  Domestic violence concerns - no HTN SCREENING - SEE VITALS  SEXUAL HEALTH  Sexually active in the past year - Yes with female.  Need/want STI testing today? - no  Concerns about libido or pain with sex? - yes, Estrace was too expensive   Plans for pregnancy? - n/a postpenopausal  INFECTIOUS DISEASE SCREENING  HIV - does not need  GC/CT - does not need  HepC - DOB  [birth year] - does not need  TB - does not need  DISEASE SCREENING  Lipid - does not need  DM2 - does not need  Osteoporosis - women age 33+ - does not need  CANCER SCREENING  Cervical - does not need - done 11/2014, due 11/2019  Breast - needs - has appt   Lung - does not need  Colon - does not need - reports done 06/2011  ADULT VACCINATION  Influenza - annual vaccine recommended  Td - booster every 10 years   Zoster - vaccine advised   PCV13 - was not indicated  PPSV23 - was not indicated Immunization History  Administered Date(s) Administered  . Influenza Inj Mdck Quad Pf 09/01/2017  . Influenza,inj,Quad PF,6+ Mos 11/02/2015  . Tdap 12/20/2013      Patient Instructions  Try Zantac 150 mg - 300 mg nightly for a month or two, use Tums as needed for flares during the day     Visit summary with medication list and pertinent instructions was printed for patient to review. All questions at time of visit were answered - patient instructed to contact office with any additional concerns. ER/RTC precautions were reviewed with the patient. Follow-up plan: Return in  about 1 year (around 11/28/2018) for annual checkup, sooner if needed .

## 2017-11-29 ENCOUNTER — Encounter: Payer: Self-pay | Admitting: Osteopathic Medicine

## 2017-12-16 LAB — COMPLETE METABOLIC PANEL WITH GFR
AG Ratio: 2.2 (calc) (ref 1.0–2.5)
ALBUMIN MSPROF: 4.8 g/dL (ref 3.6–5.1)
ALT: 13 U/L (ref 6–29)
AST: 18 U/L (ref 10–35)
Alkaline phosphatase (APISO): 47 U/L (ref 33–130)
BILIRUBIN TOTAL: 0.7 mg/dL (ref 0.2–1.2)
BUN: 13 mg/dL (ref 7–25)
CHLORIDE: 102 mmol/L (ref 98–110)
CO2: 31 mmol/L (ref 20–32)
CREATININE: 0.8 mg/dL (ref 0.50–0.99)
Calcium: 10.1 mg/dL (ref 8.6–10.4)
GFR, EST AFRICAN AMERICAN: 93 mL/min/{1.73_m2} (ref >=60)
GFR, EST NON AFRICAN AMERICAN: 80 mL/min/{1.73_m2} (ref >=60)
GLUCOSE: 104 mg/dL — AB (ref 65–99)
Globulin: 2.2 g/dL (calc) (ref 1.9–3.7)
Potassium: 4.6 mmol/L (ref 3.5–5.3)
Sodium: 139 mmol/L (ref 135–146)
TOTAL PROTEIN: 7 g/dL (ref 6.1–8.1)

## 2017-12-16 LAB — CBC
HCT: 38.2 % (ref 35.0–45.0)
HEMOGLOBIN: 12.9 g/dL (ref 11.7–15.5)
MCH: 29.5 pg (ref 27.0–33.0)
MCHC: 33.8 g/dL (ref 32.0–36.0)
MCV: 87.4 fL (ref 80.0–100.0)
MPV: 10.2 fL (ref 7.5–12.5)
PLATELETS: 265 10*3/uL (ref 140–400)
RBC: 4.37 10*6/uL (ref 3.80–5.10)
RDW: 12.9 % (ref 11.0–15.0)
WBC: 4.7 10*3/uL (ref 3.8–10.8)

## 2017-12-16 LAB — LIPID PANEL
Cholesterol: 219 mg/dL — ABNORMAL HIGH (ref <200)
HDL: 88 mg/dL (ref >50)
LDL CHOLESTEROL (CALC): 118 mg/dL — AB
Non-HDL Cholesterol (Calc): 131 mg/dL (calc) — ABNORMAL HIGH (ref <130)
TRIGLYCERIDES: 44 mg/dL (ref <150)
Total CHOL/HDL Ratio: 2.5 (calc) (ref <5.0)

## 2017-12-18 LAB — TEST AUTHORIZATION

## 2017-12-18 NOTE — Addendum Note (Signed)
Addended by: Deirdre Pippins on: 12/18/2017 12:52 PM   Modules accepted: Orders

## 2017-12-19 LAB — HEMOGLOBIN A1C W/OUT EAG: HEMOGLOBIN A1C: 5.9 %{Hb} — AB (ref <5.7)

## 2017-12-20 ENCOUNTER — Telehealth: Payer: Self-pay

## 2017-12-20 NOTE — Telephone Encounter (Signed)
Pt called inquiring if health form with recent labs faxed to their employer. Is paperwork still pending? Pls advise, thanks.

## 2017-12-21 NOTE — Telephone Encounter (Signed)
Sent to fax today

## 2017-12-21 NOTE — Telephone Encounter (Signed)
Pt has been updated. Biometrics form faxed earlier today. Confirmation rec'd.

## 2017-12-27 ENCOUNTER — Ambulatory Visit (INDEPENDENT_AMBULATORY_CARE_PROVIDER_SITE_OTHER): Payer: BLUE CROSS/BLUE SHIELD

## 2017-12-27 DIAGNOSIS — Z1231 Encounter for screening mammogram for malignant neoplasm of breast: Secondary | ICD-10-CM | POA: Diagnosis not present

## 2018-02-25 ENCOUNTER — Other Ambulatory Visit: Payer: Self-pay | Admitting: Osteopathic Medicine

## 2018-02-25 DIAGNOSIS — F411 Generalized anxiety disorder: Secondary | ICD-10-CM

## 2018-02-26 NOTE — Telephone Encounter (Signed)
[facility] requesting RF on Xanax.  Last written 03-03-17 for #30, 1 up to TID PRN ANX   RX pended, please send if appropriate

## 2018-05-29 ENCOUNTER — Telehealth: Payer: Self-pay

## 2018-05-29 NOTE — Telephone Encounter (Signed)
Pt called stating heartburn / indigestion still an issue. Pt has been taking Zantac as recommended by provider. Would like provider to send in a rx for heartburn  / indigestion to [facility] in [place]. Pls advise, thanks.

## 2018-05-30 MED ORDER — OMEPRAZOLE 20 MG PO CPDR: 20.0000 mg | DELAYED_RELEASE_CAPSULE | Freq: Every day | ORAL | 0 refills | Status: DC

## 2018-05-30 NOTE — Telephone Encounter (Signed)
I sent in a few month supply of omeprazole.  If this is helpful, typically we can have patient stop this after the stomach lining has had a chance to heal.  If she continues to have problems despite stopping the medicine, she should follow-up in the office for further discussion

## 2018-05-30 NOTE — Telephone Encounter (Signed)
Pt advised.

## 2018-07-24 ENCOUNTER — Telehealth: Payer: Self-pay

## 2018-07-24 ENCOUNTER — Other Ambulatory Visit: Payer: Self-pay | Admitting: Osteopathic Medicine

## 2018-07-24 DIAGNOSIS — F411 Generalized anxiety disorder: Secondary | ICD-10-CM

## 2018-07-24 NOTE — Telephone Encounter (Signed)
Pt advised of recommendations. Will continue BID until she can be seen here in office. States she brought in BP machine to last OV and it was calibrated. Recommended she bring log of home Bps with her to follow up. Call sent to scheduler.   Pt reported she was seen at [facility] on [address]. Will get those records and I also asked pt to bring copy of her records from urgent Care to appt also.

## 2018-07-24 NOTE — Telephone Encounter (Signed)
Pt left msg stating she went to Urgent Care yesterday morning due to elevated BP of 192/98. Pt states she had EKG and CXR done, all normal. Pt doubled her BP medication and was able to get BP to come down to 166/88.  Pt wanting to know if she should just start taking BP medication BID now or if she needs appt to follow up on this  Please advise

## 2018-07-24 NOTE — Telephone Encounter (Signed)
I see it now - I didn't hit the "refresh" button Looks like UC was concerned for HTN urgency, recommended ER transfer but pt refused Can f/u for appt either way to re-verify home BP cuff

## 2018-07-24 NOTE — Telephone Encounter (Signed)
Appt scheduled 07/30/18

## 2018-07-24 NOTE — Telephone Encounter (Signed)
I don't see any urgent care records in our system or in [facility] Let's have her follow-up in the office to recheck  Can we ask where she went and can we request records? If she's measuring BP at home, let's have her bring her home BP monitor in to appt Can continue bid meds for now

## 2018-07-25 NOTE — Telephone Encounter (Signed)
Requested medication (s) are due for refill today: yes  Requested medication (s) are on the active medication list: Yes  Last refill:  02/28/18  Future visit scheduled: Yes  Notes to clinic:  See request    Requested Prescriptions  Pending Prescriptions Disp Refills   ALPRAZolam (XANAX) 0.25 MG tablet [Pharmacy Med Name: ALPRAZOLAM 0.25MG  TABLETS] 30 tablet 0    Sig: TAKE 1 TABLET BY MOUTH THREE TIMES DAILY AS NEEDED FOR ANXIETY     Not Delegated - Psychiatry:  Anxiolytics/Hypnotics Failed - 07/24/2018  8:42 PM      Failed - This refill cannot be delegated      Failed - Urine Drug Screen completed in last 360 days.      Failed - Valid encounter within last 6 months    Recent Outpatient Visits          8 months ago Annual physical exam   [facility] [place] Sunnie NielsenAlexander, Natalie, DO   1 year ago Essential hypertension   [facility] [place] Sunnie NielsenAlexander, Natalie, DO   1 year ago Annual physical exam   [facility] [place] Sunnie NielsenAlexander, Natalie, DO   2 years ago Annual physical exam   [place] [facility] [place] Sunnie NielsenAlexander, Natalie, DO   2 years ago Gastroesophageal reflux disease, esophagitis presence not specified   [facility] [place] Sunnie NielsenAlexander, Natalie, DO

## 2018-07-30 ENCOUNTER — Encounter: Payer: Self-pay | Admitting: Osteopathic Medicine

## 2018-07-30 ENCOUNTER — Ambulatory Visit (INDEPENDENT_AMBULATORY_CARE_PROVIDER_SITE_OTHER): Payer: BLUE CROSS/BLUE SHIELD | Admitting: Osteopathic Medicine

## 2018-07-30 VITALS — BP 156/70 | HR 67 | Ht 64.0 in | Wt 174.0 lb

## 2018-07-30 DIAGNOSIS — I10 Essential (primary) hypertension: Secondary | ICD-10-CM | POA: Diagnosis not present

## 2018-07-30 DIAGNOSIS — E871 Hypo-osmolality and hyponatremia: Secondary | ICD-10-CM | POA: Diagnosis not present

## 2018-07-30 MED ORDER — VALSARTAN-HYDROCHLOROTHIAZIDE 80-12.5 MG PO TABS: 1.0000 | ORAL_TABLET | Freq: Every day | ORAL | 1 refills | Status: DC

## 2018-07-30 NOTE — Patient Instructions (Addendum)
STOP bisoprolol-hydrochlorothiazide START valsartan-hydrochlorothiazide   Call or message us this Friday or next Monday with BP readings.    Continue to monitor your blood pressure, recognizing that your monitor is pretty close to ours with the top number, but is off by quite a bit on the bottom number.   If BP above goal (140 top number or 90 bottom number) wait 5 minutes and recheck it.

## 2018-07-30 NOTE — Progress Notes (Signed)
HPI: Meghan Hubbard is a 62 y.o. female who  has a past medical history of Anxiety state (09/24/2015), Essential hypertension (09/24/2015), GERD (gastroesophageal reflux disease) (09/24/2015), and Hypertension.  she presents to [facility] today, 07/30/18,  for chief complaint of:  BP check-up  Home BP checks few times per day   Brings BP monitor today  Our machine 156/70  Hers was 166/93  Went to UC not long ago, BP systolic >170's. Reports feeling head pressure when BP high, no vision changes, (+)anxiety, xanax helps prn.      At today's visit... Past medical history, surgical history, and family history reviewed and updated as needed.  Current medication list and allergy/intolerance information reviewed and updated as needed. (See remainder of HPI, ROS, Phys Exam below)        ASSESSMENT/PLAN: The encounter diagnosis was Essential hypertension.   Orders Placed This Encounter  Procedures  . CBC  . COMPLETE METABOLIC PANEL WITH GFR  . TSH  . Magnesium     Meds ordered this encounter  Medications  . valsartan-hydrochlorothiazide (DIOVAN-HCT) 80-12.5 MG tablet    Sig: Take 1 tablet by mouth daily.    Dispense:  30 tablet    Refill:  1    Patient Instructions  STOP bisoprolol-hydrochlorothiazide START valsartan-hydrochlorothiazide   Call or message [place] this Friday or next Monday with BP readings.    Continue to monitor your blood pressure, recognizing that your monitor is pretty close to ours with the top number, but is off by quite a bit on the bottom number.   If BP above goal (140 top number or 90 bottom number) wait 5 minutes and recheck it.          Follow-up plan: Return for recheck BP depending on what home numbers are looking like.  .                             ############################################ ############################################ ############################################ ############################################    Current Meds  Medication Sig  . ALPRAZolam (XANAX) 0.25 MG tablet TAKE 1 TABLET BY MOUTH THREE TIMES DAILY AS NEEDED FOR ANXIETY  . bisoprolol-hydrochlorothiazide (ZIAC) 5-6.25 MG tablet Take 1 tablet by mouth daily.  Marland Kitchen estradiol (ESTRACE VAGINAL) 0.1 MG/GM vaginal cream Place 1 Applicatorful vaginally at bedtime. Daily for 1-2 weeks, then decrease to 2-3 times per week  . meloxicam (MOBIC) 15 MG tablet Take 1 tablet (15 mg total) by mouth daily.  Marland Kitchen omeprazole (PRILOSEC) 20 MG capsule Take 1 capsule (20 mg total) by mouth daily.  Marland Kitchen tiZANidine (ZANAFLEX) 4 MG tablet Take 0.5-1 tablets (2-4 mg total) by mouth at bedtime.    No Known Allergies     Review of Systems:  Constitutional: No recent illness  HEENT: +headache, no vision change  Cardiac: No  chest pain, No  pressure, No palpitations  Respiratory:  No  shortness of breath. No  Cough  Gastrointestinal: No  abdominal pain, no change on bowel habits  Musculoskeletal: No new myalgia/arthralgia  Skin: No  Rash  Neurologic: No  weakness, No  Dizziness  Psychiatric: No  concerns with depression, +concerns with anxiety  Exam:  BP (!) 156/70   Pulse 67   Ht 5\' 4"  (1.626 m)   Wt 174 lb (78.9 kg)   BMI 29.87 kg/m   Constitutional: VS see above. General Appearance: alert, well-developed, well-nourished, NAD  Eyes: Normal lids and conjunctive, non-icteric sclera  Ears, Nose,  Mouth, Throat: MMM, Normal external inspection ears/nares/mouth/lips/gums.  Neck: No masses, trachea midline.   Respiratory: Normal respiratory effort. no wheeze, no rhonchi, no rales  Cardiovascular: S1/S2 normal, no murmur, no rub/gallop auscultated. RRR.   Musculoskeletal: Gait normal. Symmetric and  independent movement of all extremities  Neurological: Normal balance/coordination. No tremor.  Skin: warm, dry, intact.   Psychiatric: Normal judgment/insight. Normal mood and affect. Oriented x3.       Visit summary with medication list and pertinent instructions was printed for patient to review, patient was advised to alert us if any updates are needed. All questions at time of visit were answered - patient instructed to contact office with any additional concerns. ER/RTC precautions were reviewed with the patient and understanding verbalized.    Please note: voice recognition software was used to produce this document, and typos may escape review. Please contact Dr. Lyn HollingsheadAlexander for any needed clarifications.    Follow up plan: Return for recheck BP depending on what home numbers are looking like. .Marland Kitchen

## 2018-07-31 LAB — CBC
HCT: 37.2 % (ref 35.0–45.0)
Hemoglobin: 12.5 g/dL (ref 11.7–15.5)
MCH: 30.5 pg (ref 27.0–33.0)
MCHC: 33.6 g/dL (ref 32.0–36.0)
MCV: 90.7 fL (ref 80.0–100.0)
MPV: 10 fL (ref 7.5–12.5)
Platelets: 255 10*3/uL (ref 140–400)
RBC: 4.1 10*6/uL (ref 3.80–5.10)
RDW: 12.5 % (ref 11.0–15.0)
WBC: 6.4 10*3/uL (ref 3.8–10.8)

## 2018-07-31 LAB — COMPLETE METABOLIC PANEL WITH GFR
AG Ratio: 2.1 (calc) (ref 1.0–2.5)
ALT: 19 U/L (ref 6–29)
AST: 15 U/L (ref 10–35)
Albumin: 4.6 g/dL (ref 3.6–5.1)
Alkaline phosphatase (APISO): 58 U/L (ref 33–130)
BUN: 23 mg/dL (ref 7–25)
CALCIUM: 9.9 mg/dL (ref 8.6–10.4)
CO2: 25 mmol/L (ref 20–32)
Chloride: 97 mmol/L — ABNORMAL LOW (ref 98–110)
Creat: 0.92 mg/dL (ref 0.50–0.99)
GFR, Est African American: 78 mL/min/{1.73_m2} (ref >=60)
GFR, Est Non African American: 67 mL/min/{1.73_m2} (ref >=60)
Globulin: 2.2 g/dL (calc) (ref 1.9–3.7)
Glucose, Bld: 100 mg/dL — ABNORMAL HIGH (ref 65–99)
Potassium: 3.7 mmol/L (ref 3.5–5.3)
Sodium: 134 mmol/L — ABNORMAL LOW (ref 135–146)
Total Bilirubin: 0.3 mg/dL (ref 0.2–1.2)
Total Protein: 6.8 g/dL (ref 6.1–8.1)

## 2018-07-31 LAB — MAGNESIUM: Magnesium: 1.5 mg/dL (ref 1.5–2.5)

## 2018-07-31 LAB — TSH: TSH: 4.33 mIU/L (ref 0.40–4.50)

## 2018-08-02 NOTE — Addendum Note (Signed)
Addended by: Deirdre PippinsALEXANDER, Gregory Barrick M on: 08/02/2018 12:45 PM   Modules accepted: Orders

## 2018-08-02 NOTE — Progress Notes (Signed)
Pt has seen results on MyChart and message also sent for patient to call back if any questions.

## 2018-08-06 ENCOUNTER — Telehealth: Payer: Self-pay

## 2018-08-06 NOTE — Telephone Encounter (Signed)
Pt left vm msg stating that she is feeling worse with valsartan/hctz med. As per pt she is having s/e - constant palpitations that comes and go with chest pain/ blurry vision. Pt is requesting if med can be switch to another medication. Pls advise, thanks.

## 2018-08-07 NOTE — Telephone Encounter (Signed)
Left a detailed vm msg for pt regarding provider's note. Call back info provided.  

## 2018-08-07 NOTE — Telephone Encounter (Signed)
We can certainly switch but if she is having chest pains and vision problems she really needs to be evaluated in the office or possibly an emergency room.

## 2018-08-09 ENCOUNTER — Telehealth: Payer: Self-pay

## 2018-08-09 NOTE — Telephone Encounter (Addendum)
Pt returned call back - per vm msg, she is not having chest pains. She is experiencing palpitations with val/hctz. Contacted pt -  was informed to make an appt for evaluation and she said no. Per pt, there is no need for her to come in, she wants her bp med switched. [facility]. Pls advise, thanks.

## 2018-08-09 NOTE — Telephone Encounter (Signed)
Spoke with Pt. She is very unhappy she has to make a follow up appointment. Requested appointment as soon as possible. Placed on Charley's schedule for tomorrow, PCP does not have openings until next week.

## 2018-08-09 NOTE — Telephone Encounter (Signed)
Not without an appointment to discuss options and to repeat EKG and labs, listen to heart, etc. It is a liability issue for me to just switch medicines without seeing patients when they are having serious side effects. If she has an objection to this plan, she is free to report me and/or find another doctor.

## 2018-08-09 NOTE — Telephone Encounter (Signed)
Patient called and left a voicemail to cancel and reschedule appointment. Called patient back and booked her for the next available with Dr.Alexander on 12/23.

## 2018-08-10 ENCOUNTER — Ambulatory Visit: Payer: Self-pay | Admitting: Physician Assistant

## 2018-08-10 MED ORDER — METOPROLOL SUCCINATE ER 50 MG PO TB24: 50.0000 mg | ORAL_TABLET | Freq: Every day | ORAL | 0 refills | Status: DC

## 2018-08-10 NOTE — Addendum Note (Signed)
Addended by: Deirdre PippinsALEXANDER, Keira Bohlin M on: 08/10/2018 08:16 AM   Modules accepted: Orders

## 2018-08-10 NOTE — Telephone Encounter (Signed)
Spoke with Pt, she is on the way to the hospital [facility] for evaluation. Her BP is elevated and her pulse is "very high." Advised her to keep her appt with PCP on Monday, and to let us know what is done today. Verbalized understanding.

## 2018-08-10 NOTE — Telephone Encounter (Signed)
Please call patient...  To clarify: my recommendation for visit in office was to evaluate palpitations, which is not a common side effect of blood pressure medicines. Not seeing her until Monday, she is risking a complication if she has something serious causing her heart symptoms. THAT is why I requested she be seen.   The BP meds are beside the point, but we could be more efficient if we went ahead and changed them then can keep appointment Monday to see if this is working to control her BP but also to avoid palpitations, though patient is doing this at her own risk without evaluation in office. Can STOP valsartan-HCT, can START metoprolol. [facility]     (I'm still concerned about her palpitations and "supposed" chest pain which she initially reported but then denied when speaking to [place]).

## 2018-08-13 ENCOUNTER — Ambulatory Visit: Payer: Self-pay | Admitting: Osteopathic Medicine

## 2018-08-13 MED ORDER — PANTOPRAZOLE SODIUM 40 MG PO TBEC
40.00 | DELAYED_RELEASE_TABLET | ORAL | Status: DC
Start: 2018-08-12 — End: 2018-08-13

## 2018-08-13 MED ORDER — ASPIRIN EC 81 MG PO TBEC
81.00 | DELAYED_RELEASE_TABLET | ORAL | Status: DC
Start: 2018-08-12 — End: 2018-08-13

## 2018-08-13 MED ORDER — NITROGLYCERIN 0.4 MG SL SUBL
0.40 | SUBLINGUAL_TABLET | SUBLINGUAL | Status: DC
Start: ? — End: 2018-08-13

## 2018-08-13 MED ORDER — POTASSIUM CHLORIDE 20 MEQ PO PACK
40.00 | PACK | ORAL | Status: DC
Start: ? — End: 2018-08-13

## 2018-08-13 MED ORDER — METOPROLOL SUCCINATE ER 50 MG PO TB24
50.00 | ORAL_TABLET | ORAL | Status: DC
Start: ? — End: 2018-08-13

## 2018-08-13 MED ORDER — ACETAMINOPHEN 325 MG PO TABS
650.00 | ORAL_TABLET | ORAL | Status: DC
Start: ? — End: 2018-08-13

## 2018-08-13 MED ORDER — ALPRAZOLAM 0.25 MG PO TABS
0.25 | ORAL_TABLET | ORAL | Status: DC
Start: ? — End: 2018-08-13

## 2018-08-13 NOTE — Telephone Encounter (Signed)
Patient called in to cancel appointment for today at 4pm with Dr.Alexander. States she left a Engineer, technical salesvoicemail as well with [place]. Patient [facility] Friday 12/20 and Saturday 12/21. States she was treated for elevated blood pressure and heart damage. Also states she was in the hospital because medication had not been changed. She wanted to do a hospital follow up in two weeks. Appointment has been scheduled. No further questions or concerns at this time.

## 2018-08-13 NOTE — Telephone Encounter (Signed)
Noted I listened to voicemail she left Jeri LagerVanicia  See previous phone notes  Patient was advised to come in for evaluation when she reported concerning "side effects from meds" and was upset that I would not change meds over the phone. I stand by my decision, we can discuss at her upcoming visit.

## 2018-08-23 ENCOUNTER — Other Ambulatory Visit: Payer: Self-pay

## 2018-08-23 MED ORDER — OMEPRAZOLE 20 MG PO CPDR: 20.0000 mg | DELAYED_RELEASE_CAPSULE | Freq: Every day | ORAL | 0 refills | Status: DC

## 2018-09-04 ENCOUNTER — Encounter: Payer: Self-pay | Admitting: Osteopathic Medicine

## 2018-09-04 ENCOUNTER — Ambulatory Visit (INDEPENDENT_AMBULATORY_CARE_PROVIDER_SITE_OTHER): Payer: BLUE CROSS/BLUE SHIELD | Admitting: Osteopathic Medicine

## 2018-09-04 VITALS — BP 123/76 | HR 63 | Temp 98.0°F | Wt 175.0 lb

## 2018-09-04 DIAGNOSIS — I10 Essential (primary) hypertension: Secondary | ICD-10-CM | POA: Diagnosis not present

## 2018-09-04 MED ORDER — ASPIRIN EC 81 MG PO TBEC: 81.0000 mg | DELAYED_RELEASE_TABLET | Freq: Every day | ORAL | 3 refills | Status: AC

## 2018-09-04 MED ORDER — METOPROLOL SUCCINATE ER 50 MG PO TB24: 50.0000 mg | ORAL_TABLET | Freq: Every day | ORAL | 3 refills | Status: DC

## 2018-09-04 NOTE — Progress Notes (Signed)
HPI: Meghan Hubbard is a 63 y.o. female who  has a past medical history of Anxiety state (09/24/2015), Essential hypertension (09/24/2015), GERD (gastroesophageal reflux disease) (09/24/2015), and Hypertension.  she presents to [facility] today, 09/04/18,  for chief complaint of:  Hospital follow-up   Hospitalized for HTN urgency and troponin elevation, notes were reviewed. Tropes trended down, echo was ok, BP normalized on metoprolol. Doing well on Metoprolol now. Requests refill. Has not yet had follow up with cardiology on an outpatient basis.     At today's visit... Past medical history, surgical history, and family history reviewed and updated as needed.  Current medication list and allergy/intolerance information reviewed and updated as needed. (See remainder of HPI, ROS, Phys Exam below)          ASSESSMENT/PLAN: The encounter diagnosis was Essential hypertension.  Orders Placed This Encounter  Procedures  . Lipid panel     Meds ordered this encounter  Medications  . metoprolol succinate (TOPROL-XL) 50 MG 24 hr tablet    Sig: Take 1 tablet (50 mg total) by mouth daily. Take with or immediately following a meal.    Dispense:  90 tablet    Refill:  3  . aspirin EC 81 MG tablet    Sig: Take 1 tablet (81 mg total) by mouth daily.    Dispense:  90 tablet    Refill:  3       Follow-up plan: Return in about 3 months (around 12/04/2018) for LAB VISIT ONLY for cholesterol rechecl. See Dr A 6 months for BP monitoring. Sooner if needed! .                             ############################################ ############################################ ############################################ ############################################    Current Meds  Medication Sig  . ALPRAZolam (XANAX) 0.25 MG tablet TAKE 1 TABLET BY MOUTH THREE TIMES DAILY AS NEEDED FOR ANXIETY  . estradiol (ESTRACE VAGINAL)  0.1 MG/GM vaginal cream Place 1 Applicatorful vaginally at bedtime. Daily for 1-2 weeks, then decrease to 2-3 times per week  . meloxicam (MOBIC) 15 MG tablet Take 1 tablet (15 mg total) by mouth daily.  . metoprolol succinate (TOPROL-XL) 50 MG 24 hr tablet Take 1 tablet (50 mg total) by mouth daily. Take with or immediately following a meal.  . omeprazole (PRILOSEC) 20 MG capsule Take 1 capsule (20 mg total) by mouth daily.  Marland Kitchen. tiZANidine (ZANAFLEX) 4 MG tablet Take 0.5-1 tablets (2-4 mg total) by mouth at bedtime.  . [DISCONTINUED] metoprolol succinate (TOPROL-XL) 50 MG 24 hr tablet Take 1 tablet (50 mg total) by mouth daily. Take with or immediately following a meal.    No Known Allergies     Review of Systems:  Constitutional: No recent illness except as per HPI  HEENT: No  headache, no vision change  Cardiac: No  chest pain, No  pressure, No palpitations  Respiratory:  No  shortness of breath. No  Cough  Gastrointestinal: No  abdominal pain, no change on bowel habits  Musculoskeletal: No new myalgia/arthralgia  Skin: No  Rash  Hem/Onc: No  easy bruising/bleeding, No  abnormal lumps/bumps  Neurologic: No  weakness, No  Dizziness  Psychiatric: No  concerns with depression, No  concerns with anxiety  Exam:  BP 123/76 (BP Location: Left Arm, Patient Position: Sitting, Cuff Size: Normal)   Pulse 63   Temp 98 F (36.7 C) (Oral)   Wt 175  lb (79.4 kg)   BMI 30.04 kg/m   Constitutional: VS see above. General Appearance: alert, well-developed, well-nourished, NAD  Eyes: Normal lids and conjunctive, non-icteric sclera  Ears, Nose, Mouth, Throat: MMM, Normal external inspection ears/nares/mouth/lips/gums.  Neck: No masses, trachea midline.   Respiratory: Normal respiratory effort. no wheeze, no rhonchi, no rales  Cardiovascular: S1/S2 normal, no murmur, no rub/gallop auscultated. RRR.   Musculoskeletal: Gait normal. Symmetric and independent movement of all  extremities  Neurological: Normal balance/coordination. No tremor.  Skin: warm, dry, intact.   Psychiatric: Normal judgment/insight. Normal mood and affect. Oriented x3.       Visit summary with medication list and pertinent instructions was printed for patient to review, patient was advised to alert [place] if any updates are needed. All questions at time of visit were answered - patient instructed to contact office with any additional concerns. ER/RTC precautions were reviewed with the patient and understanding verbalized.   Note: Total time spent 15 minutes, greater than 50% of the visit was spent face-to-face counseling and coordinating care for the following: The encounter diagnosis was Essential hypertension..  Please note: voice recognition software was used to produce this document, and typos may escape review. Please contact Dr. Lyn Hollingshead for any needed clarifications.    Follow up plan: Return in about 3 months (around 12/04/2018) for LAB VISIT ONLY for cholesterol rechecl. See Dr A 6 months for BP monitoring. Sooner if needed! Marland Kitchen

## 2018-11-05 ENCOUNTER — Other Ambulatory Visit: Payer: Self-pay | Admitting: Osteopathic Medicine

## 2018-11-05 DIAGNOSIS — F411 Generalized anxiety disorder: Secondary | ICD-10-CM

## 2018-11-06 NOTE — Telephone Encounter (Signed)
[facility] requesting med refill for alprazolam.

## 2018-11-06 NOTE — Telephone Encounter (Signed)
Pt has been updated of med refill sent to local pharmacy. No other inquiries during call.  

## 2018-11-06 NOTE — Telephone Encounter (Signed)
Sent!

## 2018-12-17 ENCOUNTER — Other Ambulatory Visit: Payer: Self-pay

## 2018-12-17 ENCOUNTER — Encounter: Payer: Self-pay | Admitting: Sports Medicine

## 2018-12-17 ENCOUNTER — Ambulatory Visit (INDEPENDENT_AMBULATORY_CARE_PROVIDER_SITE_OTHER): Payer: BLUE CROSS/BLUE SHIELD

## 2018-12-17 ENCOUNTER — Ambulatory Visit (INDEPENDENT_AMBULATORY_CARE_PROVIDER_SITE_OTHER): Payer: BLUE CROSS/BLUE SHIELD | Admitting: Sports Medicine

## 2018-12-17 DIAGNOSIS — M5136 Other intervertebral disc degeneration, lumbar region: Secondary | ICD-10-CM | POA: Diagnosis not present

## 2018-12-17 DIAGNOSIS — M51369 Other intervertebral disc degeneration, lumbar region without mention of lumbar back pain or lower extremity pain: Secondary | ICD-10-CM | POA: Insufficient documentation

## 2018-12-17 MED ORDER — HYDROCODONE-ACETAMINOPHEN 5-325 MG PO TABS: 1.0000 | ORAL_TABLET | Freq: Three times a day (TID) | ORAL | 0 refills | Status: DC | PRN

## 2018-12-17 MED ORDER — DEXAMETHASONE 4 MG PO TABS: 4.0000 mg | ORAL_TABLET | Freq: Three times a day (TID) | ORAL | 0 refills | Status: AC

## 2018-12-17 MED ORDER — METHYLPREDNISOLONE SODIUM SUCC 125 MG IJ SOLR
125.0000 mg | Freq: Once | INTRAMUSCULAR | Status: AC
  Administered 2018-12-17: 15:00:00 125 mg via INTRAMUSCULAR

## 2018-12-17 MED ORDER — KETOROLAC TROMETHAMINE 30 MG/ML IJ SOLN
30.0000 mg | Freq: Once | INTRAMUSCULAR | Status: AC
  Administered 2018-12-17: 30 mg via INTRAMUSCULAR

## 2018-12-17 NOTE — Patient Instructions (Signed)
[facility] [place] [address] [place], [place] [address] [phone]

## 2018-12-17 NOTE — Assessment & Plan Note (Signed)
Acute axial discogenic back pain without bowel or bladder dysfunction. Toradol 30, Solu-Medrol 125 intramuscular. Decadron 3 times daily for 5 days, hydrocodone for pain. X-rays. Formal physical therapy. Out of work for 2 days followed by restricted work for the next 2 weeks. Return to see me in 4 to 6 weeks, MR for interventional planning if no better.

## 2018-12-17 NOTE — Progress Notes (Signed)
Subjective:    CC: Back pain  HPI: Last night this pleasant 63 year old female twisted wrong in bed, and woke up with severe axial back pain, radiation into the left buttock, nothing radicular down to the foot.  No bowel or bladder dysfunction, saddle numbness, constitutional symptoms, pain is worse with sitting, flexion, Valsalva.  I reviewed the past medical history, family history, social history, surgical history, and allergies today and no changes were needed.  Please see the problem list section below in epic for further details.  Past Medical History: Past Medical History:  Diagnosis Date  . Anxiety state 09/24/2015  . Essential hypertension 09/24/2015  . GERD (gastroesophageal reflux disease) 09/24/2015  . Hypertension    Past Surgical History: Past Surgical History:  Procedure Laterality Date  . CERVICAL FUSION    . FOOT SURGERY     x 3   Social History: Social History   Socioeconomic History  . Marital status: Married    Spouse name: Not on file  . Number of children: Not on file  . Years of education: Not on file  . Highest education level: Not on file  Occupational History  . Not on file  Social Needs  . Financial resource strain: Not on file  . Food insecurity:    Worry: Not on file    Inability: Not on file  . Transportation needs:    Medical: Not on file    Non-medical: Not on file  Tobacco Use  . Smoking status: Former Smoker    Last attempt to quit: 2011    Years since quitting: 9.3  . Smokeless tobacco: Never Used  Substance and Sexual Activity  . Alcohol use: No  . Drug use: No  . Sexual activity: Yes  Lifestyle  . Physical activity:    Days per week: Not on file    Minutes per session: Not on file  . Stress: Not on file  Relationships  . Social connections:    Talks on phone: Not on file    Gets together: Not on file    Attends religious service: Not on file    Active member of club or organization: Not on file    Attends meetings of  clubs or organizations: Not on file    Relationship status: Not on file  Other Topics Concern  . Not on file  Social History Narrative  . Not on file   Family History: Family History  Problem Relation Age of Onset  . Cancer Mother        LEUKEMIA  . Hypertension Mother   . Cancer Father        LUNG   Allergies: No Known Allergies Medications: See med rec.  Review of Systems: No fevers, chills, night sweats, weight loss, chest pain, or shortness of breath.   Objective:    General: Well Developed, well nourished, and in no acute distress.  Neuro: Alert and oriented x3, extra-ocular muscles intact, sensation grossly intact.  HEENT: Normocephalic, atraumatic, pupils equal round reactive to light, neck supple, no masses, no lymphadenopathy, thyroid nonpalpable.  Skin: Warm and dry, no rashes. Cardiac: Regular rate and rhythm, no murmurs rubs or gallops, no lower extremity edema.  Respiratory: Clear to auscultation bilaterally. Not using accessory muscles, speaking in full sentences. Back Exam:  Inspection: Unremarkable  Motion: Flexion 45 deg, Extension 45 deg, Side Bending to 45 deg bilaterally,  Rotation to 45 deg bilaterally  SLR laying: Negative  XSLR laying: Negative  Palpable tenderness: None. FABER:  negative. Sensory change: Gross sensation intact to all lumbar and sacral dermatomes.  Reflexes: 2+ at both patellar tendons, 2+ at achilles tendons, Babinski's downgoing.  Strength at foot  Plantar-flexion: 5/5 Dorsi-flexion: 5/5 Eversion: 5/5 Inversion: 5/5  Leg strength  Quad: 5/5 Hamstring: 5/5 Hip flexor: 5/5 Hip abductors: 5/5  Gait unremarkable.  Solu-Medrol 125, Toradol 30 given intramuscular.  Impression and Recommendations:    Lumbar degenerative disc disease Acute axial discogenic back pain without bowel or bladder dysfunction. Toradol 30, Solu-Medrol 125 intramuscular. Decadron 3 times daily for 5 days, hydrocodone for pain. X-rays. Formal physical  therapy. Out of work for 2 days followed by restricted work for the next 2 weeks. Return to see me in 4 to 6 weeks, MR for interventional planning if no better.   ___________________________________________ Ihor Austin. Benjamin Stain, M.D., ABFM., CAQSM. Primary Care and Sports Medicine [facility]  Adjunct Professor of Family Medicine  [employer]

## 2018-12-18 ENCOUNTER — Telehealth: Payer: Self-pay | Admitting: Osteopathic Medicine

## 2018-12-18 NOTE — Telephone Encounter (Signed)
Patient states that the x-ray report states she was sitting on her daughters bed, but she was actually laying down. Wanted to know if that could be changed before FMLA paperwork was completed and sent out. She thinks it will make a major difference for her insurance. Wanted it to be corrected.

## 2018-12-18 NOTE — Telephone Encounter (Signed)
From: Artis Delay Sent: 12/18/2018  10:53 AM EDT To: Sandrea Matte, LPN  Hey, Unless the patient rolled off the bed (which she didn't tell me), she must have sat up somewhere in the process of getting off the bed.  She said when she was getting up she must have twisted/threw out her back.  Unfortunately, me changing the notes isn't going to change the radiologist dictation/results and they aren't going to do an addendum to add that she was laying on the bed.  Sorry and if she gives you any trouble just send her my way, Thank You! Eileen Stanford

## 2018-12-18 NOTE — Telephone Encounter (Signed)
Meghan Hubbard - is this something you can update? I believe the Pt is referring to what was entered in the description at time of xray, which the radiologist used when he read the images. Thank you!

## 2018-12-19 NOTE — Telephone Encounter (Signed)
Patient called clinic stating her employer will not let her return with current work restrictions. [employer] was supposed to send over short term disability paperwork for completion. Pt wants to make sure this was received. Routing.

## 2018-12-19 NOTE — Telephone Encounter (Signed)
Okay, I will fill it out.  If it is wrong we will just do it again in a visit.

## 2018-12-19 NOTE — Telephone Encounter (Signed)
No, I do not have it yet but as we all know, appointments are needed for disability paperwork

## 2018-12-19 NOTE — Telephone Encounter (Signed)
Pt will get paperwork refaxed. She doesn't understand why she needs to come back in for paperwork. If Provider looks at paperwork and still needs to talk to her, she will schedule a telephone visit but doesn't understand being billed twice in the same week.

## 2018-12-20 NOTE — Telephone Encounter (Signed)
Pt advised.

## 2018-12-24 NOTE — Telephone Encounter (Signed)
I still do not have anything, she may want to just bring the forms here and drop them off.

## 2018-12-24 NOTE — Telephone Encounter (Signed)
Pt called clinic stating forms were resent last week. Questions if they have been completed. Routing.

## 2018-12-24 NOTE — Telephone Encounter (Signed)
Left VM with update.  

## 2018-12-25 ENCOUNTER — Telehealth: Payer: Self-pay | Admitting: Sports Medicine

## 2018-12-25 NOTE — Telephone Encounter (Signed)
Filled out FMLA/Disability Papers today, we sent some for scanning, the rest are available for the patient to pick up.

## 2018-12-27 ENCOUNTER — Other Ambulatory Visit: Payer: Self-pay | Admitting: Osteopathic Medicine

## 2018-12-27 DIAGNOSIS — F411 Generalized anxiety disorder: Secondary | ICD-10-CM

## 2019-01-12 ENCOUNTER — Other Ambulatory Visit: Payer: Self-pay | Admitting: Osteopathic Medicine

## 2019-01-12 NOTE — Telephone Encounter (Signed)
   Requested Prescriptions  Pending Prescriptions Disp Refills   omeprazole (PRILOSEC) 20 MG capsule [Pharmacy Med Name: OMEPRAZOL DR CAP 20MG  RX] 90 capsule 0    Sig: TAKE 1 CAPSULE DAILY     Gastroenterology: Proton Pump Inhibitors Failed - 01/12/2019  9:43 AM      Failed - Valid encounter within last 12 months    Recent Outpatient Visits          3 weeks ago Lumbar degenerative disc disease   [facility], Ihor Austin, MD   4 months ago Essential hypertension   [facility], Dorene Grebe, DO   5 months ago Essential hypertension   [place] [facility], Dorene Grebe, DO   1 year ago Annual physical exam   [facility], Dorene Grebe, DO   1 year ago Essential hypertension   [place] [facility], Dorene Grebe, DO      Future Appointments            In 2 weeks Benjamin Stain, Ihor Austin, MD [facility]

## 2019-01-28 ENCOUNTER — Encounter: Payer: Self-pay | Admitting: Sports Medicine

## 2019-01-28 ENCOUNTER — Ambulatory Visit (INDEPENDENT_AMBULATORY_CARE_PROVIDER_SITE_OTHER): Payer: BLUE CROSS/BLUE SHIELD | Admitting: Sports Medicine

## 2019-01-28 DIAGNOSIS — M5136 Other intervertebral disc degeneration, lumbar region: Secondary | ICD-10-CM

## 2019-01-28 DIAGNOSIS — M51369 Other intervertebral disc degeneration, lumbar region without mention of lumbar back pain or lower extremity pain: Secondary | ICD-10-CM

## 2019-01-28 NOTE — Assessment & Plan Note (Signed)
Persistent low back pain though improved. Localized to the left SI joint and worse with standing and walking. She had a partial improvement with previous medications given in the last visit. X-rays did show multilevel DDD. She has done formal PT. SI joint injection today. Return to see me in a month. If insufficient improvement we will proceed with MRI for epidural planning.

## 2019-01-28 NOTE — Progress Notes (Signed)
Subjective:    CC: Follow-up  HPI: Low back pain: Persistent, left-sided, localized over the SI joint, it has changed in quality, worse with standing, walking.  Better with sitting, flexion, radiation to the buttock and posterior thigh but not past the knee.  I reviewed the past medical history, family history, social history, surgical history, and allergies today and no changes were needed.  Please see the problem list section below in epic for further details.  Past Medical History: Past Medical History:  Diagnosis Date  . Anxiety state 09/24/2015  . Essential hypertension 09/24/2015  . GERD (gastroesophageal reflux disease) 09/24/2015  . Hypertension    Past Surgical History: Past Surgical History:  Procedure Laterality Date  . CERVICAL FUSION    . FOOT SURGERY     x 3   Social History: Social History   Socioeconomic History  . Marital status: Married    Spouse name: Not on file  . Number of children: Not on file  . Years of education: Not on file  . Highest education level: Not on file  Occupational History  . Not on file  Social Needs  . Financial resource strain: Not on file  . Food insecurity:    Worry: Not on file    Inability: Not on file  . Transportation needs:    Medical: Not on file    Non-medical: Not on file  Tobacco Use  . Smoking status: Former Smoker    Last attempt to quit: 2011    Years since quitting: 9.4  . Smokeless tobacco: Never Used  Substance and Sexual Activity  . Alcohol use: No  . Drug use: No  . Sexual activity: Yes  Lifestyle  . Physical activity:    Days per week: Not on file    Minutes per session: Not on file  . Stress: Not on file  Relationships  . Social connections:    Talks on phone: Not on file    Gets together: Not on file    Attends religious service: Not on file    Active member of club or organization: Not on file    Attends meetings of clubs or organizations: Not on file    Relationship status: Not on file   Other Topics Concern  . Not on file  Social History Narrative  . Not on file   Family History: Family History  Problem Relation Age of Onset  . Cancer Mother        LEUKEMIA  . Hypertension Mother   . Cancer Father        LUNG   Allergies: No Known Allergies Medications: See med rec.  Review of Systems: No fevers, chills, night sweats, weight loss, chest pain, or shortness of breath.   Objective:    General: Well Developed, well nourished, and in no acute distress.  Neuro: Alert and oriented x3, extra-ocular muscles intact, sensation grossly intact.  HEENT: Normocephalic, atraumatic, pupils equal round reactive to light, neck supple, no masses, no lymphadenopathy, thyroid nonpalpable.  Skin: Warm and dry, no rashes. Cardiac: Regular rate and rhythm, no murmurs rubs or gallops, no lower extremity edema.  Respiratory: Clear to auscultation bilaterally. Not using accessory muscles, speaking in full sentences. Back Exam:  Inspection: Unremarkable  Motion: Flexion 45 deg, Extension 45 deg, Side Bending to 45 deg bilaterally,  Rotation to 45 deg bilaterally  SLR laying: Negative  XSLR laying: Negative  Palpable tenderness: Left sacroiliac joint. FABER: negative. Sensory change: Gross sensation intact to all lumbar  and sacral dermatomes.  Reflexes: 2+ at both patellar tendons, 2+ at achilles tendons, Babinski's downgoing.  Strength at foot  Plantar-flexion: 5/5 Dorsi-flexion: 5/5 Eversion: 5/5 Inversion: 5/5  Leg strength  Quad: 5/5 Hamstring: 5/5 Hip flexor: 5/5 Hip abductors: 5/5  Gait unremarkable.  Procedure: Real-time Ultrasound Guided injection of the left sacroiliac joint Device: GE Logiq E  Verbal informed consent obtained.  Time-out conducted.  Noted no overlying erythema, induration, or other signs of local infection.  Skin prepped in a sterile fashion.  Local anesthesia: Topical Ethyl chloride.  With sterile technique and under real time ultrasound guidance:   22-gauge spinal needle dropped into the SI joint, I then injected 1 cc Kenalog 40, 2 cc lidocaine, 2 cc bupivacaine. Completed without difficulty  Pain immediately resolved suggesting accurate placement of the medication.  Advised to call if fevers/chills, erythema, induration, drainage, or persistent bleeding.  Images permanently stored and available for review in the ultrasound unit.  Impression: Technically successful ultrasound guided injection.  Impression and Recommendations:    Lumbar degenerative disc disease Persistent low back pain though improved. Localized to the left SI joint and worse with standing and walking. She had a partial improvement with previous medications given in the last visit. X-rays did show multilevel DDD. She has done formal PT. SI joint injection today. Return to see me in a month. If insufficient improvement we will proceed with MRI for epidural planning.   ___________________________________________ Ihor Austinhomas J. Benjamin Stainhekkekandam, M.D., ABFM., CAQSM. Primary Care and Sports Medicine [facility]  Adjunct Professor of Family Medicine  [employer]

## 2019-02-25 ENCOUNTER — Ambulatory Visit: Payer: BLUE CROSS/BLUE SHIELD | Admitting: Sports Medicine

## 2019-03-15 ENCOUNTER — Ambulatory Visit: Payer: BLUE CROSS/BLUE SHIELD | Admitting: Sports Medicine

## 2019-03-21 ENCOUNTER — Other Ambulatory Visit: Payer: Self-pay | Admitting: Osteopathic Medicine

## 2019-03-21 DIAGNOSIS — F411 Generalized anxiety disorder: Secondary | ICD-10-CM

## 2019-03-21 NOTE — Telephone Encounter (Signed)
Forwarding medication refill request to [facility] for review.

## 2019-07-12 ENCOUNTER — Other Ambulatory Visit: Payer: Self-pay | Admitting: Osteopathic Medicine

## 2019-12-15 ENCOUNTER — Other Ambulatory Visit: Payer: Self-pay | Admitting: Osteopathic Medicine

## 2020-01-07 ENCOUNTER — Other Ambulatory Visit: Payer: Self-pay | Admitting: Osteopathic Medicine
